# Patient Record
Sex: Male | Born: 1950 | Hispanic: No | Marital: Married | Smoking: Former smoker
Health system: Southern US, Community
[De-identification: ages and names within clinical notes are randomized; demographics above are authoritative.]

## PROBLEM LIST (undated history)

## (undated) DIAGNOSIS — Z8572 Personal history of non-Hodgkin lymphomas: Secondary | ICD-10-CM

## (undated) DIAGNOSIS — L501 Idiopathic urticaria: Secondary | ICD-10-CM

## (undated) DIAGNOSIS — D510 Vitamin B12 deficiency anemia due to intrinsic factor deficiency: Secondary | ICD-10-CM

## (undated) DIAGNOSIS — J302 Other seasonal allergic rhinitis: Secondary | ICD-10-CM

## (undated) DIAGNOSIS — N529 Male erectile dysfunction, unspecified: Secondary | ICD-10-CM

## (undated) DIAGNOSIS — Z8551 Personal history of malignant neoplasm of bladder: Secondary | ICD-10-CM

## (undated) HISTORY — PX: OTHER SURGICAL HISTORY: SHX169

## (undated) HISTORY — DX: Personal history of non-Hodgkin lymphomas: Z85.72

## (undated) HISTORY — DX: Other seasonal allergic rhinitis: J30.2

## (undated) HISTORY — DX: Personal history of malignant neoplasm of bladder: Z85.51

## (undated) HISTORY — DX: Vitamin B12 deficiency anemia due to intrinsic factor deficiency: D51.0

## (undated) HISTORY — DX: Idiopathic urticaria: L50.1

## (undated) HISTORY — DX: Male erectile dysfunction, unspecified: N52.9

---

## 2007-10-27 ENCOUNTER — Ambulatory Visit (HOSPITAL_BASED_OUTPATIENT_CLINIC_OR_DEPARTMENT_OTHER): Admission: RE | Admit: 2007-10-27 | Discharge: 2007-10-28 | Payer: Self-pay | Admitting: Urology

## 2007-10-27 ENCOUNTER — Encounter (INDEPENDENT_AMBULATORY_CARE_PROVIDER_SITE_OTHER): Payer: Self-pay | Admitting: Urology

## 2008-06-16 ENCOUNTER — Ambulatory Visit (HOSPITAL_BASED_OUTPATIENT_CLINIC_OR_DEPARTMENT_OTHER): Admission: RE | Admit: 2008-06-16 | Discharge: 2008-06-16 | Payer: Self-pay | Admitting: Urology

## 2008-06-16 ENCOUNTER — Encounter (INDEPENDENT_AMBULATORY_CARE_PROVIDER_SITE_OTHER): Payer: Self-pay | Admitting: Urology

## 2008-09-20 ENCOUNTER — Encounter (INDEPENDENT_AMBULATORY_CARE_PROVIDER_SITE_OTHER): Payer: Self-pay | Admitting: Urology

## 2008-09-20 ENCOUNTER — Ambulatory Visit (HOSPITAL_BASED_OUTPATIENT_CLINIC_OR_DEPARTMENT_OTHER): Admission: RE | Admit: 2008-09-20 | Discharge: 2008-09-20 | Payer: Self-pay | Admitting: Urology

## 2009-02-28 ENCOUNTER — Ambulatory Visit (HOSPITAL_BASED_OUTPATIENT_CLINIC_OR_DEPARTMENT_OTHER): Admission: RE | Admit: 2009-02-28 | Discharge: 2009-02-28 | Payer: Self-pay | Admitting: Urology

## 2009-02-28 ENCOUNTER — Encounter (INDEPENDENT_AMBULATORY_CARE_PROVIDER_SITE_OTHER): Payer: Self-pay | Admitting: Urology

## 2009-03-04 IMAGING — CR DG CHEST 2V
2 series · 2 of 2 positions shown · non-contrast
Comparison: None.

CLINICAL DATA: Preop, bladder tumor.
 CHEST ? 2 VIEW:

[w chest pa]
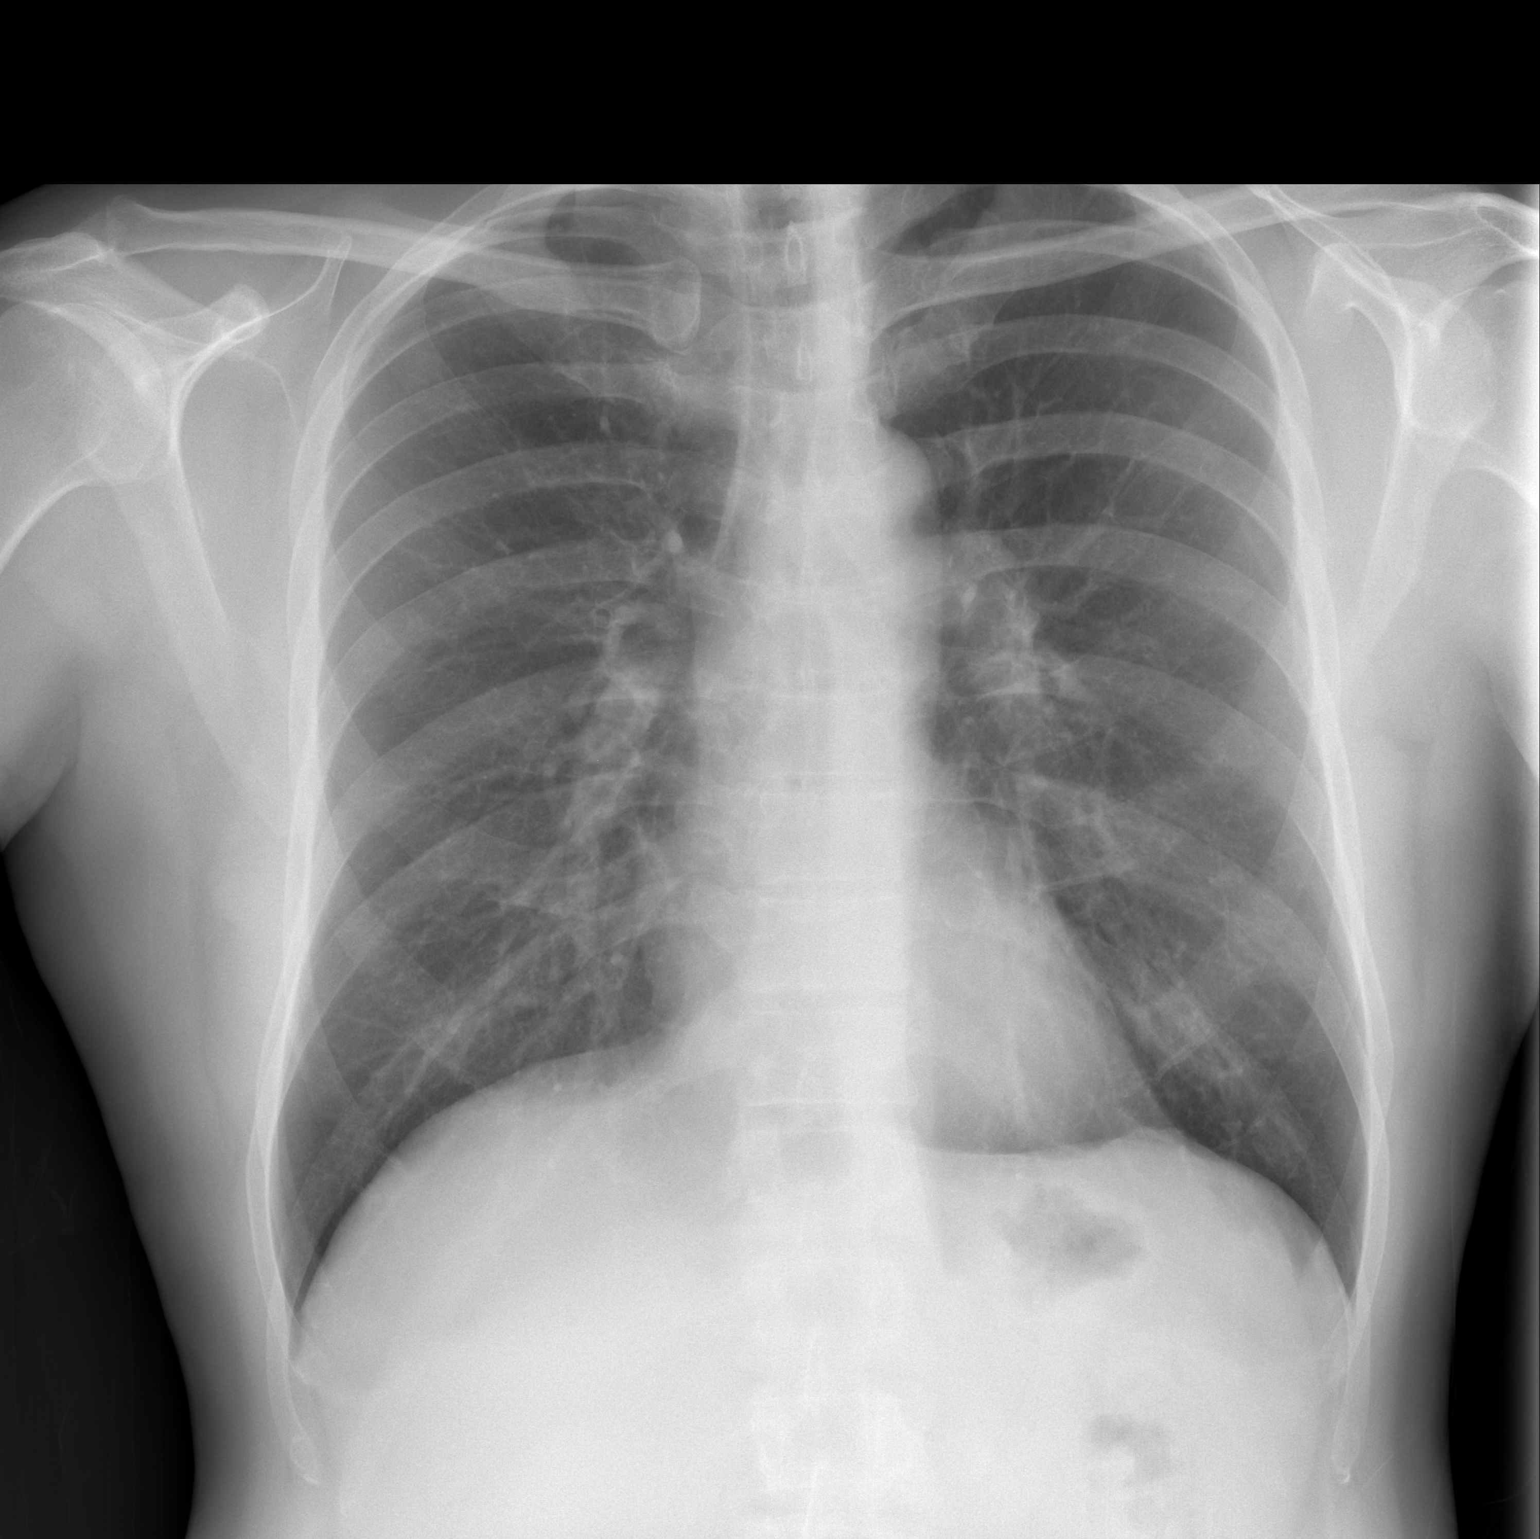

[w chest lat]
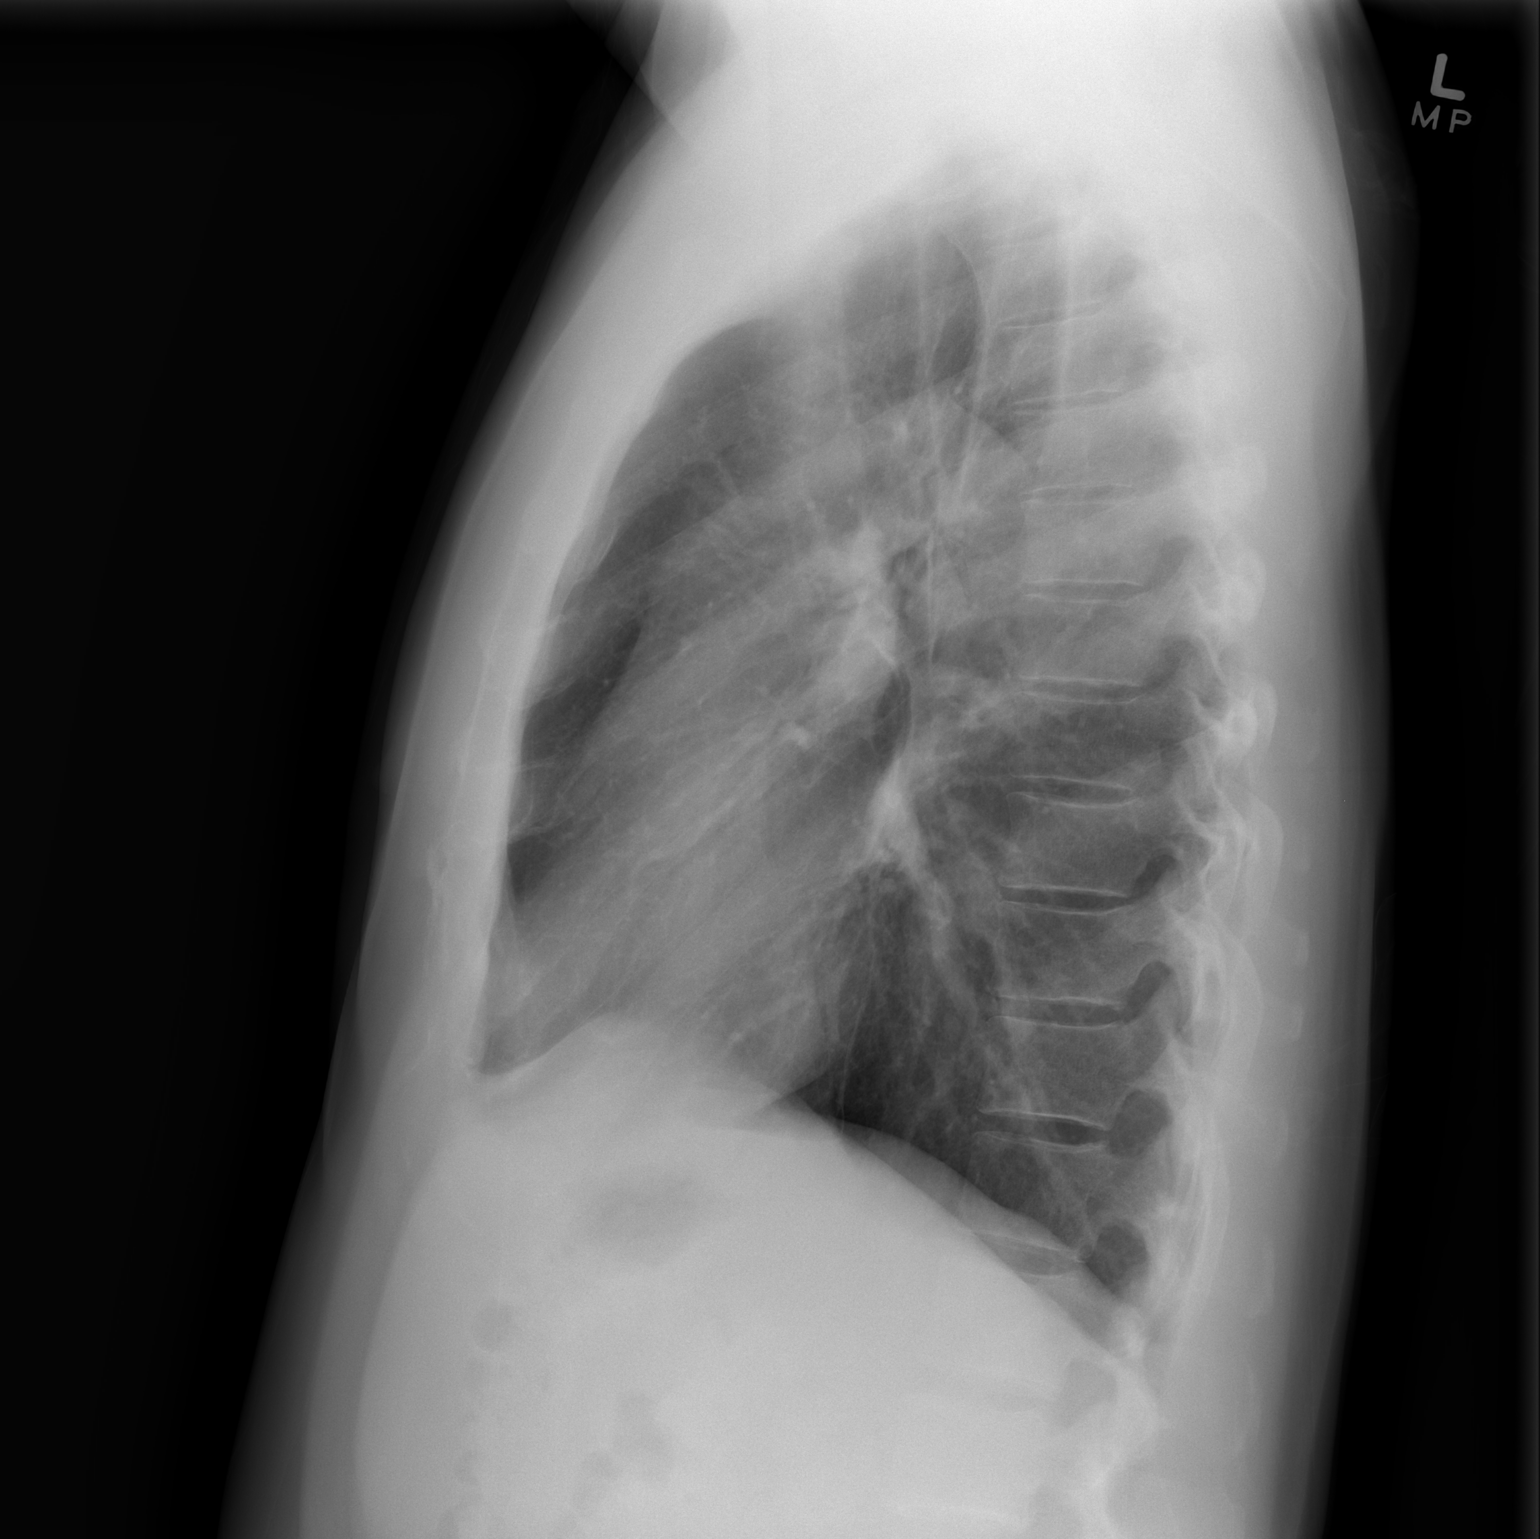

[2 of 2 positions shown; findings below may reference images not displayed]

FINDINGS: Trachea is midline.  Heart size is normal.  Lungs are clear.  No pleural fluid.
IMPRESSION: No acute findings.

## 2010-02-12 ENCOUNTER — Ambulatory Visit (HOSPITAL_BASED_OUTPATIENT_CLINIC_OR_DEPARTMENT_OTHER): Admission: RE | Admit: 2010-02-12 | Discharge: 2010-02-12 | Payer: Self-pay | Admitting: Urology

## 2010-06-18 ENCOUNTER — Ambulatory Visit (HOSPITAL_BASED_OUTPATIENT_CLINIC_OR_DEPARTMENT_OTHER): Admission: RE | Admit: 2010-06-18 | Discharge: 2010-06-18 | Payer: Self-pay | Admitting: Urology

## 2010-12-26 LAB — POCT HEMOGLOBIN-HEMACUE: Hemoglobin: 15.9 g/dL (ref 13.0–17.0)

## 2010-12-31 LAB — POCT HEMOGLOBIN-HEMACUE: Hemoglobin: 16 g/dL (ref 13.0–17.0)

## 2011-01-21 LAB — BASIC METABOLIC PANEL
CO2: 30 mEq/L (ref 19–32)
Calcium: 9.5 mg/dL (ref 8.4–10.5)
Creatinine, Ser: 0.99 mg/dL (ref 0.4–1.5)
GFR calc non Af Amer: >60 mL/min (ref >60)
Sodium: 139 mEq/L (ref 135–145)

## 2011-01-21 LAB — CBC
RBC: 4.82 MIL/uL (ref 4.22–5.81)
RDW: 13.1 % (ref 11.5–15.5)
WBC: 5.7 10*3/uL (ref 4.0–10.5)

## 2011-02-06 ENCOUNTER — Ambulatory Visit (HOSPITAL_BASED_OUTPATIENT_CLINIC_OR_DEPARTMENT_OTHER)
Admission: RE | Admit: 2011-02-06 | Discharge: 2011-02-06 | Disposition: A | Discharge status: Home / Self Care | Payer: 59 | Source: Ambulatory Visit | Attending: Urology | Admitting: Urology

## 2011-02-06 ENCOUNTER — Other Ambulatory Visit: Payer: Self-pay | Admitting: Urology

## 2011-02-06 DIAGNOSIS — Z01812 Encounter for preprocedural laboratory examination: Secondary | ICD-10-CM | POA: Insufficient documentation

## 2011-02-06 DIAGNOSIS — C670 Malignant neoplasm of trigone of bladder: Secondary | ICD-10-CM | POA: Insufficient documentation

## 2011-02-25 NOTE — Op Note (Signed)
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   POSTOPERATIVE DIAGNOSIS:  Recurrent bladder tumor, left anterior wall  (TA grade 1 TCC).   OPERATION:  Transurethral resection of bladder tumor (2 cm left anterior  wall).   ANESTHESIA:  General.   SURGEON:  Courtney Paris, M.D.   BRIEF HISTORY:  This 60 year old white male was admitted with recurrent  bladder tumors.  He presented with gross hematuria in January.  He had  normal upper tract on CT scan but a large bladder tumor at the left  bladder neck and posterior base.  This was all grade 1 TCC stage A.  Recurrence in August, and he was put on the EOquin study.  He now has 2  small recurrences found on routine surveillance cystoscopy in left  anterior wall.  He enters now to have this resected at this time.   The patient was placed on the table in the dorsal lithotomy position  after satisfactory induction of general endotracheal anesthesia.  Time  out was performed, and the patient and the procedure were then  reconfirmed.  A 21 panendoscope was inserted and the bladder inspected  using a right-angle and the 4-0 black lens.  The only tumors were near  the air bubble in the left side in the anterior wall of the bladder.  These were photographed.  There were 2 side-by-side papillary tumors.  The rest of the bladder looked clean.  Using the biopsy forceps, I  removed both of these in toto, and an area of about 2 cm was resected.  Again, this was photographed and the base fulgurated and the final  photograph made.  The bladder was  drained, scope removed, and no  catheter was left inserted.  He was taken to the recovery room in good  condition and will be later discharged as an outpatient.  Since he has  had 3 recurrences in a years' time, he will probably be placed on BCG  therapy, but a FISH test will probably be done first.      Courtney Paris, M.D.  Electronically Signed     HMK/MEDQ  D:  09/20/2008  T:  09/20/2008  Job:  [id number]

## 2011-02-25 NOTE — Discharge Summary (Signed)
Sean Mcintosh, PHIMMASONE                ACCOUNT NO.:  [id number]   MEDICAL RECORD NO.:  [id number]          PATIENT TYPE:  AMB   LOCATION:  [facility]                         FACILITY:  [facility]   PHYSICIAN:  Courtney Paris, M.D.DATE OF BIRTH:  07-05-1951   DATE OF ADMISSION:  10/27/2007  DATE OF DISCHARGE:  10/28/2007                               DISCHARGE SUMMARY   DISCHARGE DIAGNOSES:  1. Large bladder tumor, left posterior base, and bladder neck.  2. Hematuria.   OPERATIONS AND PROCEDURES:  TUR of large bladder tumor, left posterior  wall, October 27, 2007.   BRIEF HISTORY:  This 60 year old patient was admitted for a large  bladder tumor.  He had acute bleeding just last week.  Was able to be  seen in the office and upper tracts were normal, but a cysto showed a  large bladder tumor in the left side of the bladder.  He comes in to  have this resected at this time.  He is a nonsmoker.   The procedure was done in the [facility] without  difficulty.  A #22 Foley catheter was left postoperatively, and the  urine did have to be irrigated a few times but it was generally clear  from the evening on.  Since he lives in [place] and the left orifice  was probably resected as part of the TURBT, he was kept overnight for  observation.  The urine was just light pink in the morning.  He was  feeling well.  The abdomen and left flank were nontender.  He was  discharged home with his catheter and will come back to the office in a  week for followup.  He will be called regarding the path report.  He was  sent home on Cipro and pain medicine as needed on a regular diet in  improved ambulatory condition.      Courtney Paris, M.D.  Electronically Signed     HMK/MEDQ  D:  10/28/2007  T:  10/28/2007  Job:  [id number]

## 2011-02-25 NOTE — Op Note (Signed)
Sean Mcintosh, Sean Mcintosh                ACCOUNT NO.:  [id number]   MEDICAL RECORD NO.:  [id number]          PATIENT TYPE:  AMB   LOCATION:  [facility]                         FACILITY:  [facility]   PHYSICIAN:  Courtney Paris, M.D.DATE OF BIRTH:  1951/04/06   DATE OF PROCEDURE:  06/16/2008  DATE OF DISCHARGE:                               OPERATIVE REPORT   PREOPERATIVE DIAGNOSIS:  Recurrent bladder tumor posterior left base  (T1a 1 cm).   POSTOPERATIVE DIAGNOSIS:  Recurrent bladder tumor posterior left base  (T1a 1 cm).   OPERATION:  TURBT (1 cm).   ANESTHESIA:  General.   SURGEON:  Courtney Paris, M.D.   BRIEF HISTORY:  This 60 year old patient is admitted for recurrent  bladder cancer.  He had his first tumor found because of bleeding in  January of this year.  He has had two negative cystos since then but had  on his third cysto a recurrence on the left posterior base found.  He  enters to have this resected at this time.  He has also agreed to a  bladder cancer study protocol SPI-612 which is a mitomycin versus  placebo drug to help prevent recurrences.  The patient enters for  resection and treatment at this time.   The patient was placed on the operating table in dorsal lithotomy  position and after satisfactory induction of general anesthesia he was  prepped and draped in the usual sterile fashion and given Ancef IV.  After time-out was performed and the patient and the procedure were then  reconfirmed the 21 panendoscope was inserted under direct vision and  anterior urethra looked normal.  The posterior urethra was not  obstructing.  The bladder was entered.  The scar of the previous  resection was noted on the base of the bladder but on the left posterior  midline base there was a solitary recurrence.  It was between 0.5 and 1  cm in size.  Pictures were taken.  This was then completely excised with  two cold cup biopsies and the base fulgurated.  The area resected  was  about 1 cm in size.  Bleeding was minimal.  A Foley catheter was left  indwelling for the infusion of the study drug and B and O suppository  inserted.  He was taken to the recovery room in good condition and would  later be discharged as an outpatient procedure without the catheter if  he is able to void well.      Courtney Paris, M.D.  Electronically Signed     HMK/MEDQ  D:  06/16/2008  T:  06/17/2008  Job:  [id number]

## 2011-02-25 NOTE — Op Note (Signed)
[name], [name]                ACCOUNT NO.:  [id number]   MEDICAL RECORD NO.:  [id number]          PATIENT TYPE:  AMB   LOCATION:  [facility]                         FACILITY:  [facility]   PHYSICIAN:  Courtney Paris, M.D.DATE OF BIRTH:  1951/01/09   DATE OF PROCEDURE:  02/28/2009  DATE OF DISCHARGE:                               OPERATIVE REPORT   PREOPERATIVE DIAGNOSIS:  TA low grade TCC left posterior base.   POSTOPERATIVE DIAGNOSIS:  TA low grade TCC left posterior base.   OPERATION:  Transurethral resection of bladder tumor (1 cm).   ANESTHESIA:  General.   SURGEON:  Rexene Edison Kimbrought, M.D.   BRIEF HISTORY:  This 60 year old patient is admitted with recurrent  bladder cancer found on routine surveillance cystoscopy.  His first  tumor was a grade 1 TCC of the left bladder neck and base.  Upper tracts  were normal.  This was done January 2009.  This was all superficial  grade 1 transitional cell carcinoma.  The cysto was negative in April  2009 but he had a recurrent grade 1 tumor in August 2009.  He had a  TURBT at that time and put on the EOquin study.  He had another grade 1  recurrent tumor in December 2009, finished BCG in February 2010 and  cysto this month showed a small 1-1.5 cm tumor in the left posterior  base.  He needs to have this resected at this time.   PROCEDURE IN DETAIL:  The patient was placed on the operating table in  the dorsal lithotomy position.  After satisfactory induction of general  anesthesia, he was prepped and draped with Betadine and given IV Cipro.  A time-out was then performed and the patient and the procedure were  then reconfirmed.  A 21 panendoscope was inserted.  No anterior  strictures were seen.  The posterior urethra was mildly obstructing in a  bilobar fashion.  The bladder was entered.  The bladder was carefully  inspected and a small tumor was seen and photographed at the left  posterior base between two vessels.  With cold cup biopsy  forceps, the  lesion and the slightly irritated area just below this were biopsied,  the total area of resection was about 1 cm.  The base was fulgurated  with a Bugbee electrode to effect good hemostasis.  The bladder seemed  to be fairly thick and I did not think a Foley catheter would need to be  left.  The bladder was drained and scope removed and he was given a B  and O suppository and some antral urethral Xylocaine anesthesia.  He  will later be sent home as an outpatient and follow-up next week for  further disposition pending path report.      Courtney Paris, M.D.  Electronically Signed     HMK/MEDQ  D:  02/28/2009  T:  02/28/2009  Job:  [id number]

## 2011-02-25 NOTE — Op Note (Signed)
Sean Mcintosh, Sean Mcintosh                ACCOUNT NO.:  [id number]   MEDICAL RECORD NO.:  [id number]          PATIENT TYPE:  AMB   LOCATION:  [facility]                         FACILITY:  [facility]   PHYSICIAN:  Courtney Paris, M.D.DATE OF BIRTH:  [birth year]   DATE OF PROCEDURE:  10/27/2007  DATE OF DISCHARGE:                               OPERATIVE REPORT   PREOPERATIVE DIAGNOSIS:  Large bladder tumor bladder neck and left  trigone.   POSTOPERATIVE DIAGNOSIS:  Large bladder tumor bladder neck and left  trigone.   OPERATION:  TUR large bladder tumor (greater than 5 cm).   ANESTHESIA:  General.   SURGEON:  Courtney Paris, M.D.   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  The bleeding  actually began the day before.  Cystoscopy showed a large bladder tumor  at the left bladder neck.  Upper tracts were normal on CT scan and he  enters now for resection of this at this time.   The patient was placed on the operating table in dorsal lithotomy  position after satisfactory induction of general anesthesia, was prepped  and draped in the usual sterile fashion and given IV Cipro.  A 24  continuous flow resectoscope was inserted and  the bladder inspected.  The large tumor at the bladder neck extending down onto the left trigone  was photographed and then with first the regular loop and then the  bladder wall loop, this was carefully shaved down to muscle, excising  the entire lesion.  This must have been very close to the left orifice  as I could not identify this after the resection.  Hemostasis was good.  The chips were evacuated from the bladder and photographs were then made  of the resultant defect in the bladder.  The scope was removed and a 22  Foley catheter was inserted, 10 mL was inserted into the balloon and the  irrigant was clear.  The patient was then taken recovery room and will  be kept for  overnight observation since he lives in [place], [place], and because the left orifice may have been involved with the  resection.      Courtney Paris, M.D.  Electronically Signed     HMK/MEDQ  D:  10/27/2007  T:  10/27/2007  Job:  [id number]

## 2011-02-27 NOTE — Op Note (Signed)
  NAME:  Sean Mcintosh, Sean Mcintosh          ACCOUNT NO.:  [id number]  MEDICAL RECORD NO.:  [id number]           PATIENT TYPE:  LOCATION:                                 FACILITY:  PHYSICIAN:  Courtney Paris, M.D.  DATE OF BIRTH:  DATE OF PROCEDURE:  02/06/2011 DATE OF DISCHARGE:                              OPERATIVE REPORT   PREOPERATIVE DIAGNOSIS:  Possible recurrent bladder cancer, left and right trigone.  POSTOPERATIVE DIAGNOSIS:  Possible recurrent bladder cancer, left and right trigone.  OPERATION:  Transurethral resection of bladder tumor 1.5 cm, left and right trigone.  ANESTHESIA:  General.  SURGEON:  Courtney Paris, M.D.  BRIEF HISTORY:  This 60 year old patient is admitted with recurrent low- grade bladder cancer.  First tumor was a large but stage A low-grade transitional cell carcinoma, resected in January 2009.  He had a recurrence in August 2009 and December 2009.  He finished BCG in February 2010.  He had a negative biopsy done of some inflammatory tissue, May 2010, probably from the BCG treatments.  FISH was negative in May 2010, so we have not done any more BCG.  He did have a small recurrence in April 2011 and in September 2011 when I gave him some mitomycin postoperatively, which he had more bleeding than usual.  He has no irritative symptoms but on cystoscopy in April 2012, he had a small possible recurrence on the right trigone overlying the right orifice and another small area on the left trigone.  He is admitted now for biopsy of these lesions.  PROCEDURE IN DETAIL:  The patient was placed on the operating table in dorsal lithotomy position.  After satisfactory induction of general anesthesia, he was prepped and draped with Betadine in usual sterile fashion.  Time-out was then performed and the patient and procedure then reconfirmed.  After pictures were made of the lesion overlying the right ureteral orifice which was the larger one, biopsies  were made carefully with the cold cup biopsy forceps of the right and then also lesion on the left trigone.  These were sent as separate specimens.  These were cauterized mildly with the Bugbee electrode to effect good hemostasis. Scope was removed after draining the bladder and urethral lidocaine was instilled as well as a B and O suppository.  He was taken to recovery room in good condition, to be later discharged as an outpatient.  We will go over the pathology report next week in the office.     Courtney Paris, M.D.    HMK/MEDQ  D:  02/06/2011  T:  02/06/2011  Job:  [id number]  Electronically Signed by Vic Blackbird M.D. on 02/27/2011 05:19:55 PM

## 2011-07-03 LAB — CBC
HCT: 43.3
MCV: 90.8
Platelets: 281
WBC: 5.3

## 2011-07-03 LAB — URINALYSIS, ROUTINE W REFLEX MICROSCOPIC
Bilirubin Urine: NEGATIVE
Glucose, UA: NEGATIVE
Hgb urine dipstick: NEGATIVE
Ketones, ur: NEGATIVE
Protein, ur: NEGATIVE

## 2011-07-03 LAB — COMPREHENSIVE METABOLIC PANEL
AST: 17
CO2: 28
Calcium: 9.2
Creatinine, Ser: 1.03
GFR calc non Af Amer: >60

## 2011-07-16 LAB — BASIC METABOLIC PANEL
BUN: 10
Calcium: 9.2
Creatinine, Ser: 1.04
GFR calc non Af Amer: >60

## 2011-07-16 LAB — URINE MICROSCOPIC-ADD ON

## 2011-07-16 LAB — URINALYSIS, ROUTINE W REFLEX MICROSCOPIC
Leukocytes, UA: NEGATIVE
Nitrite: NEGATIVE
Specific Gravity, Urine: 1.016
Urobilinogen, UA: 0.2

## 2011-07-16 LAB — CBC
Platelets: 265
RDW: 12.8

## 2012-06-01 DIAGNOSIS — C679 Malignant neoplasm of bladder, unspecified: Secondary | ICD-10-CM | POA: Insufficient documentation

## 2016-01-27 DIAGNOSIS — M7741 Metatarsalgia, right foot: Secondary | ICD-10-CM | POA: Insufficient documentation

## 2016-01-27 DIAGNOSIS — M778 Other enthesopathies, not elsewhere classified: Secondary | ICD-10-CM | POA: Insufficient documentation

## 2016-11-19 DIAGNOSIS — H2513 Age-related nuclear cataract, bilateral: Secondary | ICD-10-CM | POA: Diagnosis not present

## 2016-11-19 DIAGNOSIS — H5203 Hypermetropia, bilateral: Secondary | ICD-10-CM | POA: Diagnosis not present

## 2016-12-16 DIAGNOSIS — J3489 Other specified disorders of nose and nasal sinuses: Secondary | ICD-10-CM | POA: Diagnosis not present

## 2016-12-16 DIAGNOSIS — J309 Allergic rhinitis, unspecified: Secondary | ICD-10-CM | POA: Diagnosis not present

## 2016-12-16 DIAGNOSIS — J208 Acute bronchitis due to other specified organisms: Secondary | ICD-10-CM | POA: Diagnosis not present

## 2016-12-25 DIAGNOSIS — Z6822 Body mass index (BMI) 22.0-22.9, adult: Secondary | ICD-10-CM | POA: Diagnosis not present

## 2016-12-25 DIAGNOSIS — R509 Fever, unspecified: Secondary | ICD-10-CM | POA: Diagnosis not present

## 2016-12-25 DIAGNOSIS — J101 Influenza due to other identified influenza virus with other respiratory manifestations: Secondary | ICD-10-CM | POA: Diagnosis not present

## 2016-12-29 DIAGNOSIS — Z6822 Body mass index (BMI) 22.0-22.9, adult: Secondary | ICD-10-CM | POA: Diagnosis not present

## 2016-12-29 DIAGNOSIS — J111 Influenza due to unidentified influenza virus with other respiratory manifestations: Secondary | ICD-10-CM | POA: Diagnosis not present

## 2017-02-12 DIAGNOSIS — Z0000 Encounter for general adult medical examination without abnormal findings: Secondary | ICD-10-CM | POA: Diagnosis not present

## 2017-02-12 DIAGNOSIS — Z6823 Body mass index (BMI) 23.0-23.9, adult: Secondary | ICD-10-CM | POA: Diagnosis not present

## 2017-02-12 DIAGNOSIS — R590 Localized enlarged lymph nodes: Secondary | ICD-10-CM | POA: Diagnosis not present

## 2017-02-12 DIAGNOSIS — Z1389 Encounter for screening for other disorder: Secondary | ICD-10-CM | POA: Diagnosis not present

## 2017-02-17 DIAGNOSIS — R590 Localized enlarged lymph nodes: Secondary | ICD-10-CM | POA: Insufficient documentation

## 2017-04-13 DIAGNOSIS — R590 Localized enlarged lymph nodes: Secondary | ICD-10-CM | POA: Diagnosis not present

## 2017-04-24 HISTORY — PX: INGUINAL LYMPH NODE BIOPSY: SHX5865

## 2017-04-29 ENCOUNTER — Other Ambulatory Visit (HOSPITAL_COMMUNITY)
Admission: RE | Admit: 2017-04-29 | Discharge: 2017-04-29 | Disposition: A | Discharge status: Home / Self Care | Payer: Self-pay | Source: Ambulatory Visit | Attending: Vascular Surgery | Admitting: Vascular Surgery

## 2017-04-29 DIAGNOSIS — Z8551 Personal history of malignant neoplasm of bladder: Secondary | ICD-10-CM | POA: Diagnosis not present

## 2017-04-29 DIAGNOSIS — R590 Localized enlarged lymph nodes: Secondary | ICD-10-CM | POA: Diagnosis not present

## 2017-04-29 DIAGNOSIS — Z87891 Personal history of nicotine dependence: Secondary | ICD-10-CM | POA: Diagnosis not present

## 2017-04-29 DIAGNOSIS — L501 Idiopathic urticaria: Secondary | ICD-10-CM | POA: Diagnosis not present

## 2017-04-29 DIAGNOSIS — C8305 Small cell B-cell lymphoma, lymph nodes of inguinal region and lower limb: Secondary | ICD-10-CM | POA: Diagnosis not present

## 2017-04-29 DIAGNOSIS — Z79899 Other long term (current) drug therapy: Secondary | ICD-10-CM | POA: Diagnosis not present

## 2017-04-29 DIAGNOSIS — C8585 Other specified types of non-Hodgkin lymphoma, lymph nodes of inguinal region and lower limb: Secondary | ICD-10-CM | POA: Diagnosis not present

## 2017-04-29 DIAGNOSIS — N529 Male erectile dysfunction, unspecified: Secondary | ICD-10-CM | POA: Diagnosis not present

## 2017-04-30 ENCOUNTER — Other Ambulatory Visit (HOSPITAL_COMMUNITY)
Admission: RE | Admit: 2017-04-30 | Disposition: A | Discharge status: Home / Self Care | Payer: Self-pay | Source: Ambulatory Visit | Attending: Pathology | Admitting: Pathology

## 2017-04-30 DIAGNOSIS — C8305 Small cell B-cell lymphoma, lymph nodes of inguinal region and lower limb: Secondary | ICD-10-CM | POA: Diagnosis not present

## 2017-05-07 DIAGNOSIS — C8580 Other specified types of non-Hodgkin lymphoma, unspecified site: Secondary | ICD-10-CM | POA: Diagnosis not present

## 2017-05-11 DIAGNOSIS — C8300 Small cell B-cell lymphoma, unspecified site: Secondary | ICD-10-CM | POA: Insufficient documentation

## 2017-05-19 DIAGNOSIS — Z1159 Encounter for screening for other viral diseases: Secondary | ICD-10-CM | POA: Diagnosis not present

## 2017-05-19 DIAGNOSIS — C8308 Small cell B-cell lymphoma, lymph nodes of multiple sites: Secondary | ICD-10-CM | POA: Diagnosis not present

## 2017-05-19 DIAGNOSIS — C8590 Non-Hodgkin lymphoma, unspecified, unspecified site: Secondary | ICD-10-CM | POA: Diagnosis not present

## 2017-05-28 DIAGNOSIS — Z8551 Personal history of malignant neoplasm of bladder: Secondary | ICD-10-CM | POA: Diagnosis not present

## 2017-06-05 DIAGNOSIS — C8590 Non-Hodgkin lymphoma, unspecified, unspecified site: Secondary | ICD-10-CM | POA: Diagnosis not present

## 2017-06-05 DIAGNOSIS — C8308 Small cell B-cell lymphoma, lymph nodes of multiple sites: Secondary | ICD-10-CM | POA: Diagnosis not present

## 2017-06-09 DIAGNOSIS — C8308 Small cell B-cell lymphoma, lymph nodes of multiple sites: Secondary | ICD-10-CM | POA: Diagnosis not present

## 2017-06-12 ENCOUNTER — Encounter (HOSPITAL_COMMUNITY): Payer: Self-pay

## 2017-08-07 LAB — TISSUE HYBRIDIZATION TO NCBH

## 2017-08-25 DIAGNOSIS — Z23 Encounter for immunization: Secondary | ICD-10-CM | POA: Diagnosis not present

## 2017-09-22 DIAGNOSIS — C8300 Small cell B-cell lymphoma, unspecified site: Secondary | ICD-10-CM | POA: Diagnosis not present

## 2017-09-22 DIAGNOSIS — C8308 Small cell B-cell lymphoma, lymph nodes of multiple sites: Secondary | ICD-10-CM | POA: Diagnosis not present

## 2017-10-02 DIAGNOSIS — D485 Neoplasm of uncertain behavior of skin: Secondary | ICD-10-CM | POA: Diagnosis not present

## 2017-10-02 DIAGNOSIS — D0461 Carcinoma in situ of skin of right upper limb, including shoulder: Secondary | ICD-10-CM | POA: Diagnosis not present

## 2017-10-02 DIAGNOSIS — Z85828 Personal history of other malignant neoplasm of skin: Secondary | ICD-10-CM | POA: Diagnosis not present

## 2017-10-02 DIAGNOSIS — L570 Actinic keratosis: Secondary | ICD-10-CM | POA: Diagnosis not present

## 2017-10-02 DIAGNOSIS — L812 Freckles: Secondary | ICD-10-CM | POA: Diagnosis not present

## 2017-10-02 DIAGNOSIS — D1801 Hemangioma of skin and subcutaneous tissue: Secondary | ICD-10-CM | POA: Diagnosis not present

## 2017-10-02 DIAGNOSIS — L821 Other seborrheic keratosis: Secondary | ICD-10-CM | POA: Diagnosis not present

## 2017-11-23 DIAGNOSIS — H2513 Age-related nuclear cataract, bilateral: Secondary | ICD-10-CM | POA: Diagnosis not present

## 2017-11-23 DIAGNOSIS — H52203 Unspecified astigmatism, bilateral: Secondary | ICD-10-CM | POA: Diagnosis not present

## 2017-11-23 DIAGNOSIS — H524 Presbyopia: Secondary | ICD-10-CM | POA: Diagnosis not present

## 2017-11-27 DIAGNOSIS — R5383 Other fatigue: Secondary | ICD-10-CM | POA: Diagnosis not present

## 2017-11-27 DIAGNOSIS — Z6823 Body mass index (BMI) 23.0-23.9, adult: Secondary | ICD-10-CM | POA: Diagnosis not present

## 2017-11-27 DIAGNOSIS — J302 Other seasonal allergic rhinitis: Secondary | ICD-10-CM | POA: Diagnosis not present

## 2017-11-27 DIAGNOSIS — Z13220 Encounter for screening for lipoid disorders: Secondary | ICD-10-CM | POA: Diagnosis not present

## 2017-11-27 DIAGNOSIS — D519 Vitamin B12 deficiency anemia, unspecified: Secondary | ICD-10-CM | POA: Diagnosis not present

## 2017-11-27 DIAGNOSIS — Z125 Encounter for screening for malignant neoplasm of prostate: Secondary | ICD-10-CM | POA: Diagnosis not present

## 2017-11-27 DIAGNOSIS — Z8572 Personal history of non-Hodgkin lymphomas: Secondary | ICD-10-CM | POA: Diagnosis not present

## 2017-11-27 DIAGNOSIS — L501 Idiopathic urticaria: Secondary | ICD-10-CM | POA: Diagnosis not present

## 2017-12-11 DIAGNOSIS — E538 Deficiency of other specified B group vitamins: Secondary | ICD-10-CM | POA: Diagnosis not present

## 2017-12-30 DIAGNOSIS — D510 Vitamin B12 deficiency anemia due to intrinsic factor deficiency: Secondary | ICD-10-CM | POA: Diagnosis not present

## 2018-01-05 DIAGNOSIS — D510 Vitamin B12 deficiency anemia due to intrinsic factor deficiency: Secondary | ICD-10-CM | POA: Diagnosis not present

## 2018-01-12 DIAGNOSIS — D510 Vitamin B12 deficiency anemia due to intrinsic factor deficiency: Secondary | ICD-10-CM | POA: Diagnosis not present

## 2018-02-02 DIAGNOSIS — C8308 Small cell B-cell lymphoma, lymph nodes of multiple sites: Secondary | ICD-10-CM | POA: Diagnosis not present

## 2018-02-02 DIAGNOSIS — C8300 Small cell B-cell lymphoma, unspecified site: Secondary | ICD-10-CM | POA: Diagnosis not present

## 2018-03-01 DIAGNOSIS — A09 Infectious gastroenteritis and colitis, unspecified: Secondary | ICD-10-CM | POA: Diagnosis not present

## 2018-03-01 DIAGNOSIS — Z6822 Body mass index (BMI) 22.0-22.9, adult: Secondary | ICD-10-CM | POA: Diagnosis not present

## 2018-03-01 DIAGNOSIS — D510 Vitamin B12 deficiency anemia due to intrinsic factor deficiency: Secondary | ICD-10-CM | POA: Diagnosis not present

## 2018-03-01 DIAGNOSIS — Z0000 Encounter for general adult medical examination without abnormal findings: Secondary | ICD-10-CM | POA: Diagnosis not present

## 2018-03-01 DIAGNOSIS — K529 Noninfective gastroenteritis and colitis, unspecified: Secondary | ICD-10-CM | POA: Diagnosis not present

## 2018-03-03 DIAGNOSIS — M722 Plantar fascial fibromatosis: Secondary | ICD-10-CM | POA: Insufficient documentation

## 2018-03-03 DIAGNOSIS — R52 Pain, unspecified: Secondary | ICD-10-CM | POA: Insufficient documentation

## 2018-03-03 DIAGNOSIS — M79672 Pain in left foot: Secondary | ICD-10-CM | POA: Diagnosis not present

## 2018-04-22 DIAGNOSIS — D510 Vitamin B12 deficiency anemia due to intrinsic factor deficiency: Secondary | ICD-10-CM | POA: Diagnosis not present

## 2018-04-22 DIAGNOSIS — J40 Bronchitis, not specified as acute or chronic: Secondary | ICD-10-CM | POA: Diagnosis not present

## 2018-04-22 DIAGNOSIS — Z6822 Body mass index (BMI) 22.0-22.9, adult: Secondary | ICD-10-CM | POA: Diagnosis not present

## 2018-06-02 DIAGNOSIS — D510 Vitamin B12 deficiency anemia due to intrinsic factor deficiency: Secondary | ICD-10-CM | POA: Diagnosis not present

## 2018-06-03 DIAGNOSIS — Z8551 Personal history of malignant neoplasm of bladder: Secondary | ICD-10-CM | POA: Diagnosis not present

## 2018-06-30 DIAGNOSIS — D510 Vitamin B12 deficiency anemia due to intrinsic factor deficiency: Secondary | ICD-10-CM | POA: Diagnosis not present

## 2018-07-23 DIAGNOSIS — Z23 Encounter for immunization: Secondary | ICD-10-CM | POA: Diagnosis not present

## 2018-07-29 DIAGNOSIS — Z85828 Personal history of other malignant neoplasm of skin: Secondary | ICD-10-CM | POA: Diagnosis not present

## 2018-07-29 DIAGNOSIS — D485 Neoplasm of uncertain behavior of skin: Secondary | ICD-10-CM | POA: Diagnosis not present

## 2018-07-29 DIAGNOSIS — L812 Freckles: Secondary | ICD-10-CM | POA: Diagnosis not present

## 2018-07-29 DIAGNOSIS — L821 Other seborrheic keratosis: Secondary | ICD-10-CM | POA: Diagnosis not present

## 2018-07-29 DIAGNOSIS — C44529 Squamous cell carcinoma of skin of other part of trunk: Secondary | ICD-10-CM | POA: Diagnosis not present

## 2018-07-29 DIAGNOSIS — L720 Epidermal cyst: Secondary | ICD-10-CM | POA: Diagnosis not present

## 2018-07-29 DIAGNOSIS — L570 Actinic keratosis: Secondary | ICD-10-CM | POA: Diagnosis not present

## 2018-07-29 DIAGNOSIS — C44629 Squamous cell carcinoma of skin of left upper limb, including shoulder: Secondary | ICD-10-CM | POA: Diagnosis not present

## 2018-08-10 DIAGNOSIS — C8300 Small cell B-cell lymphoma, unspecified site: Secondary | ICD-10-CM | POA: Diagnosis not present

## 2018-08-12 DIAGNOSIS — D510 Vitamin B12 deficiency anemia due to intrinsic factor deficiency: Secondary | ICD-10-CM | POA: Diagnosis not present

## 2018-09-13 DIAGNOSIS — D510 Vitamin B12 deficiency anemia due to intrinsic factor deficiency: Secondary | ICD-10-CM | POA: Diagnosis not present

## 2018-10-01 DIAGNOSIS — D0461 Carcinoma in situ of skin of right upper limb, including shoulder: Secondary | ICD-10-CM | POA: Diagnosis not present

## 2018-10-01 DIAGNOSIS — L812 Freckles: Secondary | ICD-10-CM | POA: Diagnosis not present

## 2018-10-01 DIAGNOSIS — L570 Actinic keratosis: Secondary | ICD-10-CM | POA: Diagnosis not present

## 2018-10-01 DIAGNOSIS — Z85828 Personal history of other malignant neoplasm of skin: Secondary | ICD-10-CM | POA: Diagnosis not present

## 2018-10-01 DIAGNOSIS — D485 Neoplasm of uncertain behavior of skin: Secondary | ICD-10-CM | POA: Diagnosis not present

## 2018-10-01 DIAGNOSIS — L821 Other seborrheic keratosis: Secondary | ICD-10-CM | POA: Diagnosis not present

## 2018-10-20 DIAGNOSIS — D510 Vitamin B12 deficiency anemia due to intrinsic factor deficiency: Secondary | ICD-10-CM | POA: Diagnosis not present

## 2018-11-25 DIAGNOSIS — H2513 Age-related nuclear cataract, bilateral: Secondary | ICD-10-CM | POA: Diagnosis not present

## 2018-11-30 DIAGNOSIS — D510 Vitamin B12 deficiency anemia due to intrinsic factor deficiency: Secondary | ICD-10-CM | POA: Diagnosis not present

## 2019-02-07 DIAGNOSIS — C8308 Small cell B-cell lymphoma, lymph nodes of multiple sites: Secondary | ICD-10-CM | POA: Diagnosis not present

## 2019-04-04 DIAGNOSIS — E538 Deficiency of other specified B group vitamins: Secondary | ICD-10-CM | POA: Diagnosis not present

## 2019-04-04 DIAGNOSIS — Z79899 Other long term (current) drug therapy: Secondary | ICD-10-CM | POA: Diagnosis not present

## 2019-04-05 DIAGNOSIS — D692 Other nonthrombocytopenic purpura: Secondary | ICD-10-CM | POA: Diagnosis not present

## 2019-04-05 DIAGNOSIS — D0472 Carcinoma in situ of skin of left lower limb, including hip: Secondary | ICD-10-CM | POA: Diagnosis not present

## 2019-04-05 DIAGNOSIS — L812 Freckles: Secondary | ICD-10-CM | POA: Diagnosis not present

## 2019-04-05 DIAGNOSIS — L820 Inflamed seborrheic keratosis: Secondary | ICD-10-CM | POA: Diagnosis not present

## 2019-04-05 DIAGNOSIS — D1801 Hemangioma of skin and subcutaneous tissue: Secondary | ICD-10-CM | POA: Diagnosis not present

## 2019-04-05 DIAGNOSIS — Z85828 Personal history of other malignant neoplasm of skin: Secondary | ICD-10-CM | POA: Diagnosis not present

## 2019-04-05 DIAGNOSIS — L570 Actinic keratosis: Secondary | ICD-10-CM | POA: Diagnosis not present

## 2019-04-05 DIAGNOSIS — D485 Neoplasm of uncertain behavior of skin: Secondary | ICD-10-CM | POA: Diagnosis not present

## 2019-04-05 DIAGNOSIS — L821 Other seborrheic keratosis: Secondary | ICD-10-CM | POA: Diagnosis not present

## 2019-04-18 DIAGNOSIS — N2889 Other specified disorders of kidney and ureter: Secondary | ICD-10-CM | POA: Diagnosis not present

## 2019-04-18 DIAGNOSIS — C8300 Small cell B-cell lymphoma, unspecified site: Secondary | ICD-10-CM | POA: Diagnosis not present

## 2019-04-19 DIAGNOSIS — C8308 Small cell B-cell lymphoma, lymph nodes of multiple sites: Secondary | ICD-10-CM | POA: Diagnosis not present

## 2019-04-19 DIAGNOSIS — C8300 Small cell B-cell lymphoma, unspecified site: Secondary | ICD-10-CM | POA: Diagnosis not present

## 2019-04-28 DIAGNOSIS — Z01818 Encounter for other preprocedural examination: Secondary | ICD-10-CM | POA: Diagnosis not present

## 2019-05-06 DIAGNOSIS — C8300 Small cell B-cell lymphoma, unspecified site: Secondary | ICD-10-CM | POA: Diagnosis not present

## 2019-05-06 DIAGNOSIS — C8308 Small cell B-cell lymphoma, lymph nodes of multiple sites: Secondary | ICD-10-CM | POA: Diagnosis not present

## 2019-05-06 DIAGNOSIS — R897 Abnormal histological findings in specimens from other organs, systems and tissues: Secondary | ICD-10-CM | POA: Diagnosis not present

## 2019-05-06 DIAGNOSIS — C9110 Chronic lymphocytic leukemia of B-cell type not having achieved remission: Secondary | ICD-10-CM | POA: Diagnosis not present

## 2019-05-06 DIAGNOSIS — R591 Generalized enlarged lymph nodes: Secondary | ICD-10-CM | POA: Diagnosis not present

## 2019-05-16 ENCOUNTER — Other Ambulatory Visit: Payer: Self-pay

## 2019-06-07 DIAGNOSIS — C8300 Small cell B-cell lymphoma, unspecified site: Secondary | ICD-10-CM | POA: Diagnosis not present

## 2019-06-07 DIAGNOSIS — C8308 Small cell B-cell lymphoma, lymph nodes of multiple sites: Secondary | ICD-10-CM | POA: Diagnosis not present

## 2019-06-09 DIAGNOSIS — Z8551 Personal history of malignant neoplasm of bladder: Secondary | ICD-10-CM | POA: Diagnosis not present

## 2019-06-09 DIAGNOSIS — N528 Other male erectile dysfunction: Secondary | ICD-10-CM | POA: Diagnosis not present

## 2019-07-12 DIAGNOSIS — C8300 Small cell B-cell lymphoma, unspecified site: Secondary | ICD-10-CM | POA: Diagnosis not present

## 2019-07-12 DIAGNOSIS — C8308 Small cell B-cell lymphoma, lymph nodes of multiple sites: Secondary | ICD-10-CM | POA: Diagnosis not present

## 2019-07-28 DIAGNOSIS — D519 Vitamin B12 deficiency anemia, unspecified: Secondary | ICD-10-CM | POA: Diagnosis not present

## 2019-07-28 DIAGNOSIS — Z23 Encounter for immunization: Secondary | ICD-10-CM | POA: Diagnosis not present

## 2019-08-09 DIAGNOSIS — C8308 Small cell B-cell lymphoma, lymph nodes of multiple sites: Secondary | ICD-10-CM | POA: Diagnosis not present

## 2019-08-09 DIAGNOSIS — C8300 Small cell B-cell lymphoma, unspecified site: Secondary | ICD-10-CM | POA: Diagnosis not present

## 2019-09-13 DIAGNOSIS — C8300 Small cell B-cell lymphoma, unspecified site: Secondary | ICD-10-CM | POA: Diagnosis not present

## 2019-09-13 DIAGNOSIS — C8308 Small cell B-cell lymphoma, lymph nodes of multiple sites: Secondary | ICD-10-CM | POA: Diagnosis not present

## 2020-06-05 DIAGNOSIS — R918 Other nonspecific abnormal finding of lung field: Secondary | ICD-10-CM | POA: Diagnosis not present

## 2020-06-05 DIAGNOSIS — C8308 Small cell B-cell lymphoma, lymph nodes of multiple sites: Secondary | ICD-10-CM | POA: Diagnosis not present

## 2020-06-05 DIAGNOSIS — K769 Liver disease, unspecified: Secondary | ICD-10-CM | POA: Diagnosis not present

## 2020-06-05 DIAGNOSIS — C8590 Non-Hodgkin lymphoma, unspecified, unspecified site: Secondary | ICD-10-CM | POA: Diagnosis not present

## 2020-06-05 DIAGNOSIS — N289 Disorder of kidney and ureter, unspecified: Secondary | ICD-10-CM | POA: Diagnosis not present

## 2020-06-12 DIAGNOSIS — C8308 Small cell B-cell lymphoma, lymph nodes of multiple sites: Secondary | ICD-10-CM | POA: Diagnosis not present

## 2020-07-04 DIAGNOSIS — L814 Other melanin hyperpigmentation: Secondary | ICD-10-CM | POA: Diagnosis not present

## 2020-07-04 DIAGNOSIS — L821 Other seborrheic keratosis: Secondary | ICD-10-CM | POA: Diagnosis not present

## 2020-07-04 DIAGNOSIS — L853 Xerosis cutis: Secondary | ICD-10-CM | POA: Diagnosis not present

## 2020-07-04 DIAGNOSIS — L570 Actinic keratosis: Secondary | ICD-10-CM | POA: Diagnosis not present

## 2020-07-04 DIAGNOSIS — Z85828 Personal history of other malignant neoplasm of skin: Secondary | ICD-10-CM | POA: Diagnosis not present

## 2020-07-04 DIAGNOSIS — D1801 Hemangioma of skin and subcutaneous tissue: Secondary | ICD-10-CM | POA: Diagnosis not present

## 2020-08-16 DIAGNOSIS — R310 Gross hematuria: Secondary | ICD-10-CM | POA: Diagnosis not present

## 2020-08-16 DIAGNOSIS — N528 Other male erectile dysfunction: Secondary | ICD-10-CM | POA: Diagnosis not present

## 2020-08-16 DIAGNOSIS — C672 Malignant neoplasm of lateral wall of bladder: Secondary | ICD-10-CM | POA: Diagnosis not present

## 2020-08-16 DIAGNOSIS — R718 Other abnormality of red blood cells: Secondary | ICD-10-CM | POA: Diagnosis not present

## 2020-08-16 DIAGNOSIS — R82998 Other abnormal findings in urine: Secondary | ICD-10-CM | POA: Diagnosis not present

## 2020-09-11 DIAGNOSIS — C8308 Small cell B-cell lymphoma, lymph nodes of multiple sites: Secondary | ICD-10-CM | POA: Diagnosis not present

## 2020-09-11 DIAGNOSIS — C8300 Small cell B-cell lymphoma, unspecified site: Secondary | ICD-10-CM | POA: Diagnosis not present

## 2020-09-15 DIAGNOSIS — C8390 Non-follicular (diffuse) lymphoma, unspecified, unspecified site: Secondary | ICD-10-CM | POA: Insufficient documentation

## 2020-11-30 DIAGNOSIS — H2513 Age-related nuclear cataract, bilateral: Secondary | ICD-10-CM | POA: Diagnosis not present

## 2020-11-30 DIAGNOSIS — H52203 Unspecified astigmatism, bilateral: Secondary | ICD-10-CM | POA: Diagnosis not present

## 2020-12-04 DIAGNOSIS — C8308 Small cell B-cell lymphoma, lymph nodes of multiple sites: Secondary | ICD-10-CM | POA: Diagnosis not present

## 2020-12-04 DIAGNOSIS — C8300 Small cell B-cell lymphoma, unspecified site: Secondary | ICD-10-CM | POA: Diagnosis not present

## 2021-01-07 DIAGNOSIS — L812 Freckles: Secondary | ICD-10-CM | POA: Diagnosis not present

## 2021-01-07 DIAGNOSIS — D485 Neoplasm of uncertain behavior of skin: Secondary | ICD-10-CM | POA: Diagnosis not present

## 2021-01-07 DIAGNOSIS — Z85828 Personal history of other malignant neoplasm of skin: Secondary | ICD-10-CM | POA: Diagnosis not present

## 2021-01-07 DIAGNOSIS — L821 Other seborrheic keratosis: Secondary | ICD-10-CM | POA: Diagnosis not present

## 2021-01-07 DIAGNOSIS — L820 Inflamed seborrheic keratosis: Secondary | ICD-10-CM | POA: Diagnosis not present

## 2021-01-24 DIAGNOSIS — J349 Unspecified disorder of nose and nasal sinuses: Secondary | ICD-10-CM | POA: Diagnosis not present

## 2021-01-24 DIAGNOSIS — Z20828 Contact with and (suspected) exposure to other viral communicable diseases: Secondary | ICD-10-CM | POA: Diagnosis not present

## 2021-01-24 DIAGNOSIS — R051 Acute cough: Secondary | ICD-10-CM | POA: Diagnosis not present

## 2021-01-24 DIAGNOSIS — J0100 Acute maxillary sinusitis, unspecified: Secondary | ICD-10-CM | POA: Diagnosis not present

## 2021-03-05 DIAGNOSIS — C8300 Small cell B-cell lymphoma, unspecified site: Secondary | ICD-10-CM | POA: Diagnosis not present

## 2021-03-05 DIAGNOSIS — Z0000 Encounter for general adult medical examination without abnormal findings: Secondary | ICD-10-CM | POA: Diagnosis not present

## 2021-03-05 DIAGNOSIS — R7989 Other specified abnormal findings of blood chemistry: Secondary | ICD-10-CM | POA: Diagnosis not present

## 2021-03-05 DIAGNOSIS — C8308 Small cell B-cell lymphoma, lymph nodes of multiple sites: Secondary | ICD-10-CM | POA: Diagnosis not present

## 2021-04-19 DIAGNOSIS — Z6822 Body mass index (BMI) 22.0-22.9, adult: Secondary | ICD-10-CM | POA: Diagnosis not present

## 2021-04-19 DIAGNOSIS — Z0000 Encounter for general adult medical examination without abnormal findings: Secondary | ICD-10-CM | POA: Diagnosis not present

## 2021-04-19 DIAGNOSIS — Z125 Encounter for screening for malignant neoplasm of prostate: Secondary | ICD-10-CM | POA: Diagnosis not present

## 2021-04-19 DIAGNOSIS — D509 Iron deficiency anemia, unspecified: Secondary | ICD-10-CM | POA: Diagnosis not present

## 2021-04-19 DIAGNOSIS — Z1331 Encounter for screening for depression: Secondary | ICD-10-CM | POA: Diagnosis not present

## 2021-05-31 DIAGNOSIS — C8300 Small cell B-cell lymphoma, unspecified site: Secondary | ICD-10-CM | POA: Diagnosis not present

## 2021-06-04 DIAGNOSIS — C8300 Small cell B-cell lymphoma, unspecified site: Secondary | ICD-10-CM | POA: Diagnosis not present

## 2021-06-24 DIAGNOSIS — E538 Deficiency of other specified B group vitamins: Secondary | ICD-10-CM | POA: Diagnosis not present

## 2021-07-24 DIAGNOSIS — L03031 Cellulitis of right toe: Secondary | ICD-10-CM | POA: Diagnosis not present

## 2021-07-24 DIAGNOSIS — D1801 Hemangioma of skin and subcutaneous tissue: Secondary | ICD-10-CM | POA: Diagnosis not present

## 2021-07-24 DIAGNOSIS — Z85828 Personal history of other malignant neoplasm of skin: Secondary | ICD-10-CM | POA: Diagnosis not present

## 2021-07-24 DIAGNOSIS — L812 Freckles: Secondary | ICD-10-CM | POA: Diagnosis not present

## 2021-07-24 DIAGNOSIS — L03011 Cellulitis of right finger: Secondary | ICD-10-CM | POA: Diagnosis not present

## 2021-07-24 DIAGNOSIS — L570 Actinic keratosis: Secondary | ICD-10-CM | POA: Diagnosis not present

## 2021-07-24 DIAGNOSIS — L578 Other skin changes due to chronic exposure to nonionizing radiation: Secondary | ICD-10-CM | POA: Diagnosis not present

## 2021-07-24 DIAGNOSIS — L821 Other seborrheic keratosis: Secondary | ICD-10-CM | POA: Diagnosis not present

## 2021-08-02 DIAGNOSIS — D510 Vitamin B12 deficiency anemia due to intrinsic factor deficiency: Secondary | ICD-10-CM | POA: Diagnosis not present

## 2021-08-30 DIAGNOSIS — D510 Vitamin B12 deficiency anemia due to intrinsic factor deficiency: Secondary | ICD-10-CM | POA: Diagnosis not present

## 2021-09-09 DIAGNOSIS — N528 Other male erectile dysfunction: Secondary | ICD-10-CM | POA: Diagnosis not present

## 2021-09-09 DIAGNOSIS — Z8551 Personal history of malignant neoplasm of bladder: Secondary | ICD-10-CM | POA: Diagnosis not present

## 2021-09-10 DIAGNOSIS — C9111 Chronic lymphocytic leukemia of B-cell type in remission: Secondary | ICD-10-CM | POA: Diagnosis not present

## 2021-09-10 DIAGNOSIS — C8300 Small cell B-cell lymphoma, unspecified site: Secondary | ICD-10-CM | POA: Diagnosis not present

## 2021-10-11 DIAGNOSIS — E538 Deficiency of other specified B group vitamins: Secondary | ICD-10-CM | POA: Diagnosis not present

## 2021-11-22 DIAGNOSIS — R5381 Other malaise: Secondary | ICD-10-CM | POA: Diagnosis not present

## 2021-11-22 DIAGNOSIS — I4891 Unspecified atrial fibrillation: Secondary | ICD-10-CM | POA: Diagnosis not present

## 2021-11-22 DIAGNOSIS — R9431 Abnormal electrocardiogram [ECG] [EKG]: Secondary | ICD-10-CM | POA: Diagnosis not present

## 2021-11-22 DIAGNOSIS — R5383 Other fatigue: Secondary | ICD-10-CM | POA: Diagnosis not present

## 2021-11-22 DIAGNOSIS — Z6822 Body mass index (BMI) 22.0-22.9, adult: Secondary | ICD-10-CM | POA: Diagnosis not present

## 2021-11-26 ENCOUNTER — Other Ambulatory Visit: Payer: Self-pay

## 2021-11-26 ENCOUNTER — Ambulatory Visit: Payer: Medicare PPO | Admitting: Cardiology

## 2021-11-26 VITALS — BP 130/72 | HR 75 | Ht 70.0 in | Wt 163.4 lb

## 2021-11-26 DIAGNOSIS — Z8551 Personal history of malignant neoplasm of bladder: Secondary | ICD-10-CM | POA: Insufficient documentation

## 2021-11-26 DIAGNOSIS — Z7901 Long term (current) use of anticoagulants: Secondary | ICD-10-CM

## 2021-11-26 DIAGNOSIS — J302 Other seasonal allergic rhinitis: Secondary | ICD-10-CM | POA: Insufficient documentation

## 2021-11-26 DIAGNOSIS — N529 Male erectile dysfunction, unspecified: Secondary | ICD-10-CM | POA: Insufficient documentation

## 2021-11-26 DIAGNOSIS — Z8572 Personal history of non-Hodgkin lymphomas: Secondary | ICD-10-CM

## 2021-11-26 DIAGNOSIS — L501 Idiopathic urticaria: Secondary | ICD-10-CM | POA: Insufficient documentation

## 2021-11-26 DIAGNOSIS — I480 Paroxysmal atrial fibrillation: Secondary | ICD-10-CM

## 2021-11-26 DIAGNOSIS — D510 Vitamin B12 deficiency anemia due to intrinsic factor deficiency: Secondary | ICD-10-CM | POA: Insufficient documentation

## 2021-11-26 MED ORDER — WARFARIN SODIUM 5 MG PO TABS: 5.0000 mg | ORAL_TABLET | Freq: Every day | ORAL | 2 refills | Status: AC

## 2021-11-26 MED ORDER — METOPROLOL TARTRATE 25 MG PO TABS: 25.0000 mg | ORAL_TABLET | Freq: Four times a day (QID) | ORAL | 1 refills | Status: AC | PRN

## 2021-11-26 NOTE — Progress Notes (Signed)
Cardiology Office Note:    Date:  11/26/2021   ID:  Sean Mcintosh [birth year], MRN [id number]  PCP:  Angelina Sheriff, MD  Cardiologist:  Shirlee More, MD   Referring MD: Angelina Sheriff, MD  ASSESSMENT:    1. Paroxysmal atrial fibrillation (HCC)   2. Hx of non-Hodgkin's lymphoma   3. Chronic anticoagulation    PLAN:    In order of problems listed above:  Sean Mcintosh had atrial fibrillation for 2 to 3 days last week which appears to be associated with his tyrosine kinase inhibitor for his lymphoma.  He stopped the drug he has notified his just.  With concerns of interactions with diltiazem we will discontinue and give him a prescription to take Lopressor as needed he will activate atrial fibrillation detection on his watch.  There is also concern of direct anticoagulants and will transition to warfarin.  Check echocardiogram for structural heart disease he will come Monday for a pro time to the office and we will need to direct him to the warfarin clinic.  I will see him back in the office in several weeks.  Next appointment 2 weeks   Medication Adjustments/Labs and Tests Ordered: Current medicines are reviewed at length with the patient today.  Concerns regarding medicines are outlined above.  No orders of the defined types were placed in this encounter.  No orders of the defined types were placed in this encounter.    Chief Complaint  Patient presents with   Atrial Fibrillation    1 week ago    History of Present Illness:    Sean Mcintosh is a 71 y.o. male with a history of non-Hodgkin's lymphoma mild CKD bladder cancer in remission who is being seen today for the evaluation of atrial fibrillation at the request of [place], John F. II, MD. Chart review shows no cardiology evaluation or testing available within epic and Care Everywhere  He was seen by his PCP 11/22/2021 with palpitation found to be in atrial fibrillation with a rapid rate noted troponin drawn  as an outpatient which was normal.  Recent labs [facility] 09/10/2021 showed a hemoglobin of 14.2 hematocrit of 42 platelets 159,000 sodium 141 potassium 3.9 creatinine 1.13 GFR 70 cc.  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  He converted afterwards.  It was not particularly symptomatic no chest pain shortness of breath or syncope.  He takes a chemotherapeutic agent tyrosine kinase inhibitor for his lymphoma which is associated with proarrhythmia and atrial fibrillation.  He stopped the drug he sent message to his oncologist was placed on diltiazem and Eliquis.  He has had no previous episodes he has no known heart disease.  I did a quick literature search and is best avoid calcium channel blockers associated with this tyrosine kinase inhibitor they caution against direct anticoagulants  We will stop both drugs and him a prescription for Lopressor to take as needed for rapid heart rate and transition to warfarin.  Also echocardiogram to assess for structural heart disease Past Medical History:  Diagnosis Date   Chronic idiopathic urticaria    Erectile dysfunction    History of bladder cancer    Hx of non-Hodgkin's lymphoma    followed by Oncology in [facility]   Pernicious anemia    Seasonal allergies     Past Surgical  History:  Procedure Laterality Date   Bladder cancer surgery     Multiple 2009-2014   INGUINAL LYMPH NODE BIOPSY Left 04/24/2017    Current Medications: Current Meds  Medication Sig   diclofenac Sodium (VOLTAREN) 1 % GEL Apply 2 g topically in the morning, at noon, in the evening, and at bedtime.   ELIQUIS 5 MG TABS tablet Take 5 mg by mouth 2 (two) times daily.   fluticasone (FLONASE) 50 MCG/ACT nasal spray Place 2 sprays into both nostrils daily.   IMBRUVICA 420 MG tablet Take 420 mg by mouth daily.   sildenafil  (REVATIO) 20 MG tablet Take 20 mg by mouth daily as needed for erectile dysfunction. Take 2 to 5 tablets daily as needed   [DISCONTINUED] diltiazem (CARDIZEM CD) 120 MG 24 hr capsule Take 120 mg by mouth daily.     Allergies:   Sulfa antibiotics and Tetracyclines & related   Social History   Socioeconomic History   Marital status: Married    Spouse name: Not on file   Number of children: Not on file   Years of education: Not on file   Highest education level: Not on file  Occupational History   Not on file  Tobacco Use   Smoking status: Former    Types: Cigarettes   Smokeless tobacco: Never  Substance and Sexual Activity   Alcohol use: Not Currently   Drug use: Never   Sexual activity: Yes  Other Topics Concern   Not on file  Social History Narrative   ** Merged History Encounter **       Social Determinants of Health   Financial Resource Strain: Not on file  Food Insecurity: Not on file  Transportation Needs: Not on file  Physical Activity: Not on file  Stress: Not on file  Social Connections: Not on file     Family History: The patient's family history includes Diabetes in his mother and sibling; Diabetes type I in his son; Other in his father.  ROS:   ROS Please see the history of present illness.     All other systems reviewed and are negative.  EKGs/Labs/Other Studies Reviewed:    The following studies were reviewed today:   EKG:  EKG is  ordered today.  The ekg ordered today is personally reviewed and demonstrates sinus rhythm normal EKG   Physical Exam:    VS:  BP 130/72 (BP Location: Left Arm, Patient Position: Sitting)    Pulse 75    Ht 5\' 10"  (1.778 m)    Wt 163 lb 6.4 oz (74.1 kg)    SpO2 96%    BMI 23.45 kg/m     Wt Readings from Last 3 Encounters:  11/26/21 163 lb 6.4 oz (74.1 kg)     GEN:  Well nourished, well developed in no acute distress HEENT: Normal NECK: No JVD; No carotid bruits LYMPHATICS: No lymphadenopathy CARDIAC: RRR, no  murmurs, rubs, gallops RESPIRATORY:  Clear to auscultation without rales, wheezing or rhonchi  ABDOMEN: Soft, non-tender, non-distended MUSCULOSKELETAL:  No edema; No deformity  SKIN: Warm and dry NEUROLOGIC:  Alert and oriented x 3 PSYCHIATRIC:  Normal affect     Signed, Shirlee More, MD  11/26/2021 3:48 PM    [facility]

## 2021-11-26 NOTE — Patient Instructions (Signed)
Medication Instructions:  Your physician has recommended you make the following change in your medication:   STOP: Diltiazem STOP: Eliquis START: Lopressor 25 mg every 6 hours PRN for HR greater than 130 or Atrial fibrillation START: Coumadin 5 mg daily (Wait 2 full doses of Eliquis to start)  *If you need a refill on your cardiac medications before your next appointment, please call your pharmacy*   Lab Work: Your physician recommends that you return for lab work in:   Labs on Monday: PT/INR  If you have labs (blood work) drawn today and your tests are completely normal, you will receive your results only by: [employer] (if you have MyChart) OR A paper copy in the mail If you have any lab test that is abnormal or we need to change your treatment, we will call you to review the results.   Testing/Procedures: Your physician has requested that you have an echocardiogram. Echocardiography is a painless test that uses sound waves to create images of your heart. It provides your doctor with information about the size and shape of your heart and how well your hearts chambers and valves are working. This procedure takes approximately one hour. There are no restrictions for this procedure.    Follow-Up: At [facility], you and your health needs are our priority.  As part of our continuing mission to provide you with exceptional heart care, we have created designated Provider Care Teams.  These Care Teams include your primary Cardiologist (physician) and Advanced Practice Providers (APPs -  Physician Assistants and Nurse Practitioners) who all work together to provide you with the care you need, when you need it.  We recommend signing up for the patient portal called "MyChart".  Sign up information is provided on this After Visit Summary.  MyChart is used to connect with patients for Virtual Visits (Telemedicine).  Patients are able to view lab/test results, encounter notes, upcoming  appointments, etc.  Non-urgent messages can be sent to your provider as well.   To learn more about what you can do with MyChart, go to [email or web address].    Your next appointment:   4 week(s)  The format for your next appointment:   In Person  Provider:   Shirlee More, MD    Other Instructions None

## 2021-12-02 ENCOUNTER — Other Ambulatory Visit: Payer: Medicare PPO

## 2021-12-09 ENCOUNTER — Other Ambulatory Visit: Payer: Medicare PPO

## 2021-12-09 DIAGNOSIS — H5203 Hypermetropia, bilateral: Secondary | ICD-10-CM | POA: Diagnosis not present

## 2021-12-09 DIAGNOSIS — H2513 Age-related nuclear cataract, bilateral: Secondary | ICD-10-CM | POA: Diagnosis not present

## 2021-12-17 DIAGNOSIS — Z8616 Personal history of COVID-19: Secondary | ICD-10-CM | POA: Diagnosis not present

## 2021-12-17 DIAGNOSIS — C8300 Small cell B-cell lymphoma, unspecified site: Secondary | ICD-10-CM | POA: Diagnosis not present

## 2021-12-23 ENCOUNTER — Ambulatory Visit: Payer: Medicare PPO | Admitting: Cardiology

## 2022-01-03 DIAGNOSIS — D510 Vitamin B12 deficiency anemia due to intrinsic factor deficiency: Secondary | ICD-10-CM | POA: Diagnosis not present

## 2022-02-04 DIAGNOSIS — L84 Corns and callosities: Secondary | ICD-10-CM | POA: Diagnosis not present

## 2022-02-04 DIAGNOSIS — D1801 Hemangioma of skin and subcutaneous tissue: Secondary | ICD-10-CM | POA: Diagnosis not present

## 2022-02-04 DIAGNOSIS — L03031 Cellulitis of right toe: Secondary | ICD-10-CM | POA: Diagnosis not present

## 2022-02-04 DIAGNOSIS — L821 Other seborrheic keratosis: Secondary | ICD-10-CM | POA: Diagnosis not present

## 2022-02-04 DIAGNOSIS — Z85828 Personal history of other malignant neoplasm of skin: Secondary | ICD-10-CM | POA: Diagnosis not present

## 2022-02-04 DIAGNOSIS — L570 Actinic keratosis: Secondary | ICD-10-CM | POA: Diagnosis not present

## 2022-02-20 DIAGNOSIS — D519 Vitamin B12 deficiency anemia, unspecified: Secondary | ICD-10-CM | POA: Diagnosis not present

## 2022-03-11 DIAGNOSIS — C969 Malignant neoplasm of lymphoid, hematopoietic and related tissue, unspecified: Secondary | ICD-10-CM | POA: Diagnosis not present

## 2022-03-11 DIAGNOSIS — R590 Localized enlarged lymph nodes: Secondary | ICD-10-CM | POA: Diagnosis not present

## 2022-03-11 DIAGNOSIS — C8300 Small cell B-cell lymphoma, unspecified site: Secondary | ICD-10-CM | POA: Diagnosis not present

## 2022-03-18 DIAGNOSIS — C8300 Small cell B-cell lymphoma, unspecified site: Secondary | ICD-10-CM | POA: Diagnosis not present

## 2022-03-27 DIAGNOSIS — C8300 Small cell B-cell lymphoma, unspecified site: Secondary | ICD-10-CM | POA: Diagnosis not present

## 2022-04-07 DIAGNOSIS — D519 Vitamin B12 deficiency anemia, unspecified: Secondary | ICD-10-CM | POA: Diagnosis not present

## 2022-04-10 DIAGNOSIS — J40 Bronchitis, not specified as acute or chronic: Secondary | ICD-10-CM | POA: Diagnosis not present

## 2022-04-10 DIAGNOSIS — J329 Chronic sinusitis, unspecified: Secondary | ICD-10-CM | POA: Diagnosis not present

## 2022-04-10 DIAGNOSIS — Z8572 Personal history of non-Hodgkin lymphomas: Secondary | ICD-10-CM | POA: Diagnosis not present

## 2022-04-16 DIAGNOSIS — C9110 Chronic lymphocytic leukemia of B-cell type not having achieved remission: Secondary | ICD-10-CM | POA: Diagnosis not present

## 2022-04-18 DIAGNOSIS — Z1159 Encounter for screening for other viral diseases: Secondary | ICD-10-CM | POA: Diagnosis not present

## 2022-04-18 DIAGNOSIS — C8300 Small cell B-cell lymphoma, unspecified site: Secondary | ICD-10-CM | POA: Diagnosis not present

## 2022-04-19 DIAGNOSIS — J209 Acute bronchitis, unspecified: Secondary | ICD-10-CM | POA: Diagnosis not present

## 2022-04-19 DIAGNOSIS — R051 Acute cough: Secondary | ICD-10-CM | POA: Diagnosis not present

## 2022-04-21 DIAGNOSIS — J329 Chronic sinusitis, unspecified: Secondary | ICD-10-CM | POA: Diagnosis not present

## 2022-04-21 DIAGNOSIS — J302 Other seasonal allergic rhinitis: Secondary | ICD-10-CM | POA: Diagnosis not present

## 2022-04-21 DIAGNOSIS — J40 Bronchitis, not specified as acute or chronic: Secondary | ICD-10-CM | POA: Diagnosis not present

## 2022-04-22 DIAGNOSIS — C8300 Small cell B-cell lymphoma, unspecified site: Secondary | ICD-10-CM | POA: Diagnosis not present

## 2022-04-22 DIAGNOSIS — N289 Disorder of kidney and ureter, unspecified: Secondary | ICD-10-CM | POA: Diagnosis not present

## 2022-04-22 DIAGNOSIS — Z5111 Encounter for antineoplastic chemotherapy: Secondary | ICD-10-CM | POA: Diagnosis not present

## 2022-05-20 DIAGNOSIS — C8300 Small cell B-cell lymphoma, unspecified site: Secondary | ICD-10-CM | POA: Diagnosis not present

## 2022-06-11 DIAGNOSIS — D519 Vitamin B12 deficiency anemia, unspecified: Secondary | ICD-10-CM | POA: Diagnosis not present

## 2022-06-17 DIAGNOSIS — C9110 Chronic lymphocytic leukemia of B-cell type not having achieved remission: Secondary | ICD-10-CM | POA: Diagnosis not present

## 2022-07-04 DIAGNOSIS — C8590 Non-Hodgkin lymphoma, unspecified, unspecified site: Secondary | ICD-10-CM | POA: Diagnosis not present

## 2022-07-25 DIAGNOSIS — R0982 Postnasal drip: Secondary | ICD-10-CM | POA: Diagnosis not present

## 2022-07-25 DIAGNOSIS — R051 Acute cough: Secondary | ICD-10-CM | POA: Diagnosis not present

## 2022-07-28 DIAGNOSIS — L821 Other seborrheic keratosis: Secondary | ICD-10-CM | POA: Diagnosis not present

## 2022-07-28 DIAGNOSIS — D225 Melanocytic nevi of trunk: Secondary | ICD-10-CM | POA: Diagnosis not present

## 2022-07-28 DIAGNOSIS — D1801 Hemangioma of skin and subcutaneous tissue: Secondary | ICD-10-CM | POA: Diagnosis not present

## 2022-07-28 DIAGNOSIS — D485 Neoplasm of uncertain behavior of skin: Secondary | ICD-10-CM | POA: Diagnosis not present

## 2022-07-28 DIAGNOSIS — L570 Actinic keratosis: Secondary | ICD-10-CM | POA: Diagnosis not present

## 2022-07-28 DIAGNOSIS — C44622 Squamous cell carcinoma of skin of right upper limb, including shoulder: Secondary | ICD-10-CM | POA: Diagnosis not present

## 2022-07-28 DIAGNOSIS — Z85828 Personal history of other malignant neoplasm of skin: Secondary | ICD-10-CM | POA: Diagnosis not present

## 2022-07-28 DIAGNOSIS — L812 Freckles: Secondary | ICD-10-CM | POA: Diagnosis not present

## 2022-07-29 DIAGNOSIS — C8300 Small cell B-cell lymphoma, unspecified site: Secondary | ICD-10-CM | POA: Diagnosis not present

## 2022-07-29 DIAGNOSIS — Z5111 Encounter for antineoplastic chemotherapy: Secondary | ICD-10-CM | POA: Diagnosis not present

## 2022-07-30 DIAGNOSIS — C8300 Small cell B-cell lymphoma, unspecified site: Secondary | ICD-10-CM | POA: Diagnosis not present

## 2022-08-01 DIAGNOSIS — J40 Bronchitis, not specified as acute or chronic: Secondary | ICD-10-CM | POA: Diagnosis not present

## 2022-08-01 DIAGNOSIS — J329 Chronic sinusitis, unspecified: Secondary | ICD-10-CM | POA: Diagnosis not present

## 2022-08-05 DIAGNOSIS — C8300 Small cell B-cell lymphoma, unspecified site: Secondary | ICD-10-CM | POA: Diagnosis not present

## 2022-08-06 DIAGNOSIS — C8300 Small cell B-cell lymphoma, unspecified site: Secondary | ICD-10-CM | POA: Diagnosis not present

## 2022-08-12 DIAGNOSIS — C8300 Small cell B-cell lymphoma, unspecified site: Secondary | ICD-10-CM | POA: Diagnosis not present

## 2022-08-19 DIAGNOSIS — C8300 Small cell B-cell lymphoma, unspecified site: Secondary | ICD-10-CM | POA: Diagnosis not present

## 2022-08-26 DIAGNOSIS — C8590 Non-Hodgkin lymphoma, unspecified, unspecified site: Secondary | ICD-10-CM | POA: Diagnosis not present

## 2022-08-26 DIAGNOSIS — C8300 Small cell B-cell lymphoma, unspecified site: Secondary | ICD-10-CM | POA: Diagnosis not present

## 2022-09-15 DIAGNOSIS — N400 Enlarged prostate without lower urinary tract symptoms: Secondary | ICD-10-CM | POA: Diagnosis not present

## 2022-09-15 DIAGNOSIS — Z8551 Personal history of malignant neoplasm of bladder: Secondary | ICD-10-CM | POA: Diagnosis not present

## 2022-09-17 DIAGNOSIS — D519 Vitamin B12 deficiency anemia, unspecified: Secondary | ICD-10-CM | POA: Diagnosis not present

## 2022-09-23 DIAGNOSIS — C8300 Small cell B-cell lymphoma, unspecified site: Secondary | ICD-10-CM | POA: Diagnosis not present

## 2022-10-01 DIAGNOSIS — J40 Bronchitis, not specified as acute or chronic: Secondary | ICD-10-CM | POA: Diagnosis not present

## 2022-10-01 DIAGNOSIS — Z6822 Body mass index (BMI) 22.0-22.9, adult: Secondary | ICD-10-CM | POA: Diagnosis not present

## 2022-10-01 DIAGNOSIS — H1031 Unspecified acute conjunctivitis, right eye: Secondary | ICD-10-CM | POA: Diagnosis not present

## 2022-10-01 DIAGNOSIS — J329 Chronic sinusitis, unspecified: Secondary | ICD-10-CM | POA: Diagnosis not present

## 2022-10-28 DIAGNOSIS — C8300 Small cell B-cell lymphoma, unspecified site: Secondary | ICD-10-CM | POA: Diagnosis not present

## 2022-11-13 DIAGNOSIS — D519 Vitamin B12 deficiency anemia, unspecified: Secondary | ICD-10-CM | POA: Diagnosis not present

## 2022-11-25 DIAGNOSIS — C8300 Small cell B-cell lymphoma, unspecified site: Secondary | ICD-10-CM | POA: Diagnosis not present

## 2022-11-28 DIAGNOSIS — J302 Other seasonal allergic rhinitis: Secondary | ICD-10-CM | POA: Diagnosis not present

## 2022-11-28 DIAGNOSIS — Z6822 Body mass index (BMI) 22.0-22.9, adult: Secondary | ICD-10-CM | POA: Diagnosis not present

## 2022-11-28 DIAGNOSIS — J40 Bronchitis, not specified as acute or chronic: Secondary | ICD-10-CM | POA: Diagnosis not present

## 2022-11-28 DIAGNOSIS — J329 Chronic sinusitis, unspecified: Secondary | ICD-10-CM | POA: Diagnosis not present

## 2022-12-12 DIAGNOSIS — D519 Vitamin B12 deficiency anemia, unspecified: Secondary | ICD-10-CM | POA: Diagnosis not present

## 2022-12-26 DIAGNOSIS — H52203 Unspecified astigmatism, bilateral: Secondary | ICD-10-CM | POA: Diagnosis not present

## 2022-12-26 DIAGNOSIS — H2513 Age-related nuclear cataract, bilateral: Secondary | ICD-10-CM | POA: Diagnosis not present

## 2023-01-02 DIAGNOSIS — Z6822 Body mass index (BMI) 22.0-22.9, adult: Secondary | ICD-10-CM | POA: Diagnosis not present

## 2023-01-02 DIAGNOSIS — H1030 Unspecified acute conjunctivitis, unspecified eye: Secondary | ICD-10-CM | POA: Diagnosis not present

## 2023-01-02 DIAGNOSIS — J302 Other seasonal allergic rhinitis: Secondary | ICD-10-CM | POA: Diagnosis not present

## 2023-01-14 DIAGNOSIS — C8300 Small cell B-cell lymphoma, unspecified site: Secondary | ICD-10-CM | POA: Diagnosis not present

## 2023-01-20 DIAGNOSIS — C8300 Small cell B-cell lymphoma, unspecified site: Secondary | ICD-10-CM | POA: Diagnosis not present

## 2023-01-28 DIAGNOSIS — Z85828 Personal history of other malignant neoplasm of skin: Secondary | ICD-10-CM | POA: Diagnosis not present

## 2023-01-28 DIAGNOSIS — L570 Actinic keratosis: Secondary | ICD-10-CM | POA: Diagnosis not present

## 2023-01-28 DIAGNOSIS — L820 Inflamed seborrheic keratosis: Secondary | ICD-10-CM | POA: Diagnosis not present

## 2023-01-28 DIAGNOSIS — D692 Other nonthrombocytopenic purpura: Secondary | ICD-10-CM | POA: Diagnosis not present

## 2023-01-28 DIAGNOSIS — L821 Other seborrheic keratosis: Secondary | ICD-10-CM | POA: Diagnosis not present

## 2023-01-28 DIAGNOSIS — L309 Dermatitis, unspecified: Secondary | ICD-10-CM | POA: Diagnosis not present

## 2023-01-28 DIAGNOSIS — L853 Xerosis cutis: Secondary | ICD-10-CM | POA: Diagnosis not present

## 2023-02-04 DIAGNOSIS — D519 Vitamin B12 deficiency anemia, unspecified: Secondary | ICD-10-CM | POA: Diagnosis not present

## 2023-02-16 DIAGNOSIS — J40 Bronchitis, not specified as acute or chronic: Secondary | ICD-10-CM | POA: Diagnosis not present

## 2023-02-16 DIAGNOSIS — H109 Unspecified conjunctivitis: Secondary | ICD-10-CM | POA: Diagnosis not present

## 2023-02-16 DIAGNOSIS — J329 Chronic sinusitis, unspecified: Secondary | ICD-10-CM | POA: Diagnosis not present

## 2023-03-23 DIAGNOSIS — D519 Vitamin B12 deficiency anemia, unspecified: Secondary | ICD-10-CM | POA: Diagnosis not present

## 2023-04-14 DIAGNOSIS — C8300 Small cell B-cell lymphoma, unspecified site: Secondary | ICD-10-CM | POA: Diagnosis not present

## 2023-04-14 DIAGNOSIS — C8308 Small cell B-cell lymphoma, lymph nodes of multiple sites: Secondary | ICD-10-CM | POA: Diagnosis not present

## 2023-04-20 DIAGNOSIS — D519 Vitamin B12 deficiency anemia, unspecified: Secondary | ICD-10-CM | POA: Diagnosis not present

## 2023-04-24 DIAGNOSIS — Z6822 Body mass index (BMI) 22.0-22.9, adult: Secondary | ICD-10-CM | POA: Diagnosis not present

## 2023-04-24 DIAGNOSIS — J069 Acute upper respiratory infection, unspecified: Secondary | ICD-10-CM | POA: Diagnosis not present

## 2023-04-24 DIAGNOSIS — R319 Hematuria, unspecified: Secondary | ICD-10-CM | POA: Diagnosis not present

## 2023-04-24 DIAGNOSIS — N390 Urinary tract infection, site not specified: Secondary | ICD-10-CM | POA: Diagnosis not present

## 2023-05-05 DIAGNOSIS — C8300 Small cell B-cell lymphoma, unspecified site: Secondary | ICD-10-CM | POA: Diagnosis not present

## 2023-05-22 DIAGNOSIS — D510 Vitamin B12 deficiency anemia due to intrinsic factor deficiency: Secondary | ICD-10-CM | POA: Diagnosis not present

## 2023-06-11 DIAGNOSIS — C8300 Small cell B-cell lymphoma, unspecified site: Secondary | ICD-10-CM | POA: Diagnosis not present

## 2023-06-11 DIAGNOSIS — D510 Vitamin B12 deficiency anemia due to intrinsic factor deficiency: Secondary | ICD-10-CM | POA: Diagnosis not present

## 2023-06-11 DIAGNOSIS — I480 Paroxysmal atrial fibrillation: Secondary | ICD-10-CM | POA: Diagnosis not present

## 2023-06-11 DIAGNOSIS — Z125 Encounter for screening for malignant neoplasm of prostate: Secondary | ICD-10-CM | POA: Diagnosis not present

## 2023-06-11 DIAGNOSIS — Z9181 History of falling: Secondary | ICD-10-CM | POA: Diagnosis not present

## 2023-06-11 DIAGNOSIS — J302 Other seasonal allergic rhinitis: Secondary | ICD-10-CM | POA: Diagnosis not present

## 2023-06-11 DIAGNOSIS — Z1339 Encounter for screening examination for other mental health and behavioral disorders: Secondary | ICD-10-CM | POA: Diagnosis not present

## 2023-06-11 DIAGNOSIS — Z0000 Encounter for general adult medical examination without abnormal findings: Secondary | ICD-10-CM | POA: Diagnosis not present

## 2023-06-16 DIAGNOSIS — D519 Vitamin B12 deficiency anemia, unspecified: Secondary | ICD-10-CM | POA: Diagnosis not present

## 2023-06-30 DIAGNOSIS — N138 Other obstructive and reflux uropathy: Secondary | ICD-10-CM | POA: Diagnosis not present

## 2023-06-30 DIAGNOSIS — N401 Enlarged prostate with lower urinary tract symptoms: Secondary | ICD-10-CM | POA: Diagnosis not present

## 2023-07-07 DIAGNOSIS — R590 Localized enlarged lymph nodes: Secondary | ICD-10-CM | POA: Diagnosis not present

## 2023-07-07 DIAGNOSIS — C8300 Small cell B-cell lymphoma, unspecified site: Secondary | ICD-10-CM | POA: Diagnosis not present

## 2023-07-07 DIAGNOSIS — C969 Malignant neoplasm of lymphoid, hematopoietic and related tissue, unspecified: Secondary | ICD-10-CM | POA: Diagnosis not present

## 2023-07-29 DIAGNOSIS — D519 Vitamin B12 deficiency anemia, unspecified: Secondary | ICD-10-CM | POA: Diagnosis not present

## 2023-07-30 DIAGNOSIS — L821 Other seborrheic keratosis: Secondary | ICD-10-CM | POA: Diagnosis not present

## 2023-07-30 DIAGNOSIS — L720 Epidermal cyst: Secondary | ICD-10-CM | POA: Diagnosis not present

## 2023-07-30 DIAGNOSIS — Z85828 Personal history of other malignant neoplasm of skin: Secondary | ICD-10-CM | POA: Diagnosis not present

## 2023-07-30 DIAGNOSIS — C44529 Squamous cell carcinoma of skin of other part of trunk: Secondary | ICD-10-CM | POA: Diagnosis not present

## 2023-07-30 DIAGNOSIS — D692 Other nonthrombocytopenic purpura: Secondary | ICD-10-CM | POA: Diagnosis not present

## 2023-07-30 DIAGNOSIS — D485 Neoplasm of uncertain behavior of skin: Secondary | ICD-10-CM | POA: Diagnosis not present

## 2023-07-30 DIAGNOSIS — L820 Inflamed seborrheic keratosis: Secondary | ICD-10-CM | POA: Diagnosis not present

## 2023-07-30 DIAGNOSIS — D2271 Melanocytic nevi of right lower limb, including hip: Secondary | ICD-10-CM | POA: Diagnosis not present

## 2023-07-30 DIAGNOSIS — D045 Carcinoma in situ of skin of trunk: Secondary | ICD-10-CM | POA: Diagnosis not present

## 2023-07-30 DIAGNOSIS — L812 Freckles: Secondary | ICD-10-CM | POA: Diagnosis not present

## 2023-07-31 DIAGNOSIS — C8300 Small cell B-cell lymphoma, unspecified site: Secondary | ICD-10-CM | POA: Diagnosis not present

## 2023-09-02 DIAGNOSIS — D519 Vitamin B12 deficiency anemia, unspecified: Secondary | ICD-10-CM | POA: Diagnosis not present

## 2023-09-05 DIAGNOSIS — Z23 Encounter for immunization: Secondary | ICD-10-CM | POA: Diagnosis not present

## 2023-09-05 DIAGNOSIS — S50811A Abrasion of right forearm, initial encounter: Secondary | ICD-10-CM | POA: Diagnosis not present

## 2023-09-14 DIAGNOSIS — C672 Malignant neoplasm of lateral wall of bladder: Secondary | ICD-10-CM | POA: Diagnosis not present

## 2023-10-05 DIAGNOSIS — C8300 Small cell B-cell lymphoma, unspecified site: Secondary | ICD-10-CM | POA: Diagnosis not present

## 2023-11-05 DIAGNOSIS — J329 Chronic sinusitis, unspecified: Secondary | ICD-10-CM | POA: Diagnosis not present

## 2023-11-05 DIAGNOSIS — J40 Bronchitis, not specified as acute or chronic: Secondary | ICD-10-CM | POA: Diagnosis not present

## 2023-11-19 DIAGNOSIS — Z6821 Body mass index (BMI) 21.0-21.9, adult: Secondary | ICD-10-CM | POA: Diagnosis not present

## 2023-11-19 DIAGNOSIS — F4321 Adjustment disorder with depressed mood: Secondary | ICD-10-CM | POA: Diagnosis not present

## 2023-11-19 DIAGNOSIS — C8300 Small cell B-cell lymphoma, unspecified site: Secondary | ICD-10-CM | POA: Diagnosis not present

## 2023-11-19 DIAGNOSIS — J40 Bronchitis, not specified as acute or chronic: Secondary | ICD-10-CM | POA: Diagnosis not present

## 2023-11-19 DIAGNOSIS — J329 Chronic sinusitis, unspecified: Secondary | ICD-10-CM | POA: Diagnosis not present

## 2023-11-19 DIAGNOSIS — H1030 Unspecified acute conjunctivitis, unspecified eye: Secondary | ICD-10-CM | POA: Diagnosis not present

## 2023-11-23 DIAGNOSIS — J329 Chronic sinusitis, unspecified: Secondary | ICD-10-CM | POA: Diagnosis not present

## 2023-11-23 DIAGNOSIS — J310 Chronic rhinitis: Secondary | ICD-10-CM | POA: Diagnosis not present

## 2023-12-17 DIAGNOSIS — D519 Vitamin B12 deficiency anemia, unspecified: Secondary | ICD-10-CM | POA: Diagnosis not present

## 2023-12-28 DIAGNOSIS — J329 Chronic sinusitis, unspecified: Secondary | ICD-10-CM | POA: Diagnosis not present

## 2023-12-28 DIAGNOSIS — J40 Bronchitis, not specified as acute or chronic: Secondary | ICD-10-CM | POA: Diagnosis not present

## 2023-12-28 DIAGNOSIS — C8300 Small cell B-cell lymphoma, unspecified site: Secondary | ICD-10-CM | POA: Diagnosis not present

## 2023-12-31 DIAGNOSIS — C8300 Small cell B-cell lymphoma, unspecified site: Secondary | ICD-10-CM | POA: Diagnosis not present

## 2023-12-31 DIAGNOSIS — D801 Nonfamilial hypogammaglobulinemia: Secondary | ICD-10-CM | POA: Diagnosis not present

## 2024-01-25 DIAGNOSIS — J310 Chronic rhinitis: Secondary | ICD-10-CM | POA: Diagnosis not present

## 2024-01-25 DIAGNOSIS — Z9989 Dependence on other enabling machines and devices: Secondary | ICD-10-CM | POA: Diagnosis not present

## 2024-01-25 DIAGNOSIS — Z5181 Encounter for therapeutic drug level monitoring: Secondary | ICD-10-CM | POA: Diagnosis not present

## 2024-01-25 DIAGNOSIS — J329 Chronic sinusitis, unspecified: Secondary | ICD-10-CM | POA: Diagnosis not present

## 2024-01-25 DIAGNOSIS — Z792 Long term (current) use of antibiotics: Secondary | ICD-10-CM | POA: Diagnosis not present

## 2024-01-25 DIAGNOSIS — G4733 Obstructive sleep apnea (adult) (pediatric): Secondary | ICD-10-CM | POA: Diagnosis not present

## 2024-01-25 DIAGNOSIS — C8590 Non-Hodgkin lymphoma, unspecified, unspecified site: Secondary | ICD-10-CM | POA: Diagnosis not present

## 2024-01-25 DIAGNOSIS — Z882 Allergy status to sulfonamides status: Secondary | ICD-10-CM | POA: Diagnosis not present

## 2024-02-01 DIAGNOSIS — L570 Actinic keratosis: Secondary | ICD-10-CM | POA: Diagnosis not present

## 2024-02-01 DIAGNOSIS — D801 Nonfamilial hypogammaglobulinemia: Secondary | ICD-10-CM | POA: Diagnosis not present

## 2024-02-01 DIAGNOSIS — C8300 Small cell B-cell lymphoma, unspecified site: Secondary | ICD-10-CM | POA: Diagnosis not present

## 2024-02-01 DIAGNOSIS — L821 Other seborrheic keratosis: Secondary | ICD-10-CM | POA: Diagnosis not present

## 2024-02-01 DIAGNOSIS — D1801 Hemangioma of skin and subcutaneous tissue: Secondary | ICD-10-CM | POA: Diagnosis not present

## 2024-02-01 DIAGNOSIS — Z85828 Personal history of other malignant neoplasm of skin: Secondary | ICD-10-CM | POA: Diagnosis not present

## 2024-02-01 DIAGNOSIS — L812 Freckles: Secondary | ICD-10-CM | POA: Diagnosis not present

## 2024-02-11 DIAGNOSIS — K4090 Unilateral inguinal hernia, without obstruction or gangrene, not specified as recurrent: Secondary | ICD-10-CM | POA: Diagnosis not present

## 2024-02-18 DIAGNOSIS — K4090 Unilateral inguinal hernia, without obstruction or gangrene, not specified as recurrent: Secondary | ICD-10-CM | POA: Diagnosis not present

## 2024-02-24 DIAGNOSIS — D519 Vitamin B12 deficiency anemia, unspecified: Secondary | ICD-10-CM | POA: Diagnosis not present

## 2024-02-29 DIAGNOSIS — C8300 Small cell B-cell lymphoma, unspecified site: Secondary | ICD-10-CM | POA: Diagnosis not present

## 2024-02-29 DIAGNOSIS — D801 Nonfamilial hypogammaglobulinemia: Secondary | ICD-10-CM | POA: Diagnosis not present

## 2024-03-29 DIAGNOSIS — D801 Nonfamilial hypogammaglobulinemia: Secondary | ICD-10-CM | POA: Diagnosis not present

## 2024-03-29 DIAGNOSIS — C8300 Small cell B-cell lymphoma, unspecified site: Secondary | ICD-10-CM | POA: Diagnosis not present

## 2024-04-01 DIAGNOSIS — D176 Benign lipomatous neoplasm of spermatic cord: Secondary | ICD-10-CM | POA: Diagnosis not present

## 2024-04-01 DIAGNOSIS — D649 Anemia, unspecified: Secondary | ICD-10-CM | POA: Diagnosis not present

## 2024-04-01 DIAGNOSIS — K4090 Unilateral inguinal hernia, without obstruction or gangrene, not specified as recurrent: Secondary | ICD-10-CM | POA: Diagnosis not present

## 2024-04-26 DIAGNOSIS — D801 Nonfamilial hypogammaglobulinemia: Secondary | ICD-10-CM | POA: Diagnosis not present

## 2024-04-26 DIAGNOSIS — C8300 Small cell B-cell lymphoma, unspecified site: Secondary | ICD-10-CM | POA: Diagnosis not present

## 2024-05-19 DIAGNOSIS — J329 Chronic sinusitis, unspecified: Secondary | ICD-10-CM | POA: Diagnosis not present

## 2024-05-19 DIAGNOSIS — J310 Chronic rhinitis: Secondary | ICD-10-CM | POA: Diagnosis not present

## 2024-05-24 DIAGNOSIS — C8300 Small cell B-cell lymphoma, unspecified site: Secondary | ICD-10-CM | POA: Diagnosis not present

## 2024-05-24 DIAGNOSIS — D801 Nonfamilial hypogammaglobulinemia: Secondary | ICD-10-CM | POA: Diagnosis not present

## 2024-06-09 DIAGNOSIS — H25013 Cortical age-related cataract, bilateral: Secondary | ICD-10-CM | POA: Diagnosis not present

## 2024-06-09 DIAGNOSIS — H5203 Hypermetropia, bilateral: Secondary | ICD-10-CM | POA: Diagnosis not present

## 2024-06-09 DIAGNOSIS — H2513 Age-related nuclear cataract, bilateral: Secondary | ICD-10-CM | POA: Diagnosis not present

## 2024-06-09 DIAGNOSIS — H02403 Unspecified ptosis of bilateral eyelids: Secondary | ICD-10-CM | POA: Diagnosis not present

## 2024-06-16 DIAGNOSIS — D519 Vitamin B12 deficiency anemia, unspecified: Secondary | ICD-10-CM | POA: Diagnosis not present

## 2024-06-28 DIAGNOSIS — C8300 Small cell B-cell lymphoma, unspecified site: Secondary | ICD-10-CM | POA: Diagnosis not present

## 2024-06-28 DIAGNOSIS — D801 Nonfamilial hypogammaglobulinemia: Secondary | ICD-10-CM | POA: Diagnosis not present

## 2024-06-29 DIAGNOSIS — R918 Other nonspecific abnormal finding of lung field: Secondary | ICD-10-CM | POA: Diagnosis not present

## 2024-06-29 DIAGNOSIS — C8300 Small cell B-cell lymphoma, unspecified site: Secondary | ICD-10-CM | POA: Diagnosis not present

## 2024-07-26 DIAGNOSIS — D801 Nonfamilial hypogammaglobulinemia: Secondary | ICD-10-CM | POA: Diagnosis not present

## 2024-07-26 DIAGNOSIS — C8300 Small cell B-cell lymphoma, unspecified site: Secondary | ICD-10-CM | POA: Diagnosis not present

## 2024-08-01 DIAGNOSIS — D519 Vitamin B12 deficiency anemia, unspecified: Secondary | ICD-10-CM | POA: Diagnosis not present

## 2024-08-02 DIAGNOSIS — L821 Other seborrheic keratosis: Secondary | ICD-10-CM | POA: Diagnosis not present

## 2024-08-02 DIAGNOSIS — D692 Other nonthrombocytopenic purpura: Secondary | ICD-10-CM | POA: Diagnosis not present

## 2024-08-02 DIAGNOSIS — D1801 Hemangioma of skin and subcutaneous tissue: Secondary | ICD-10-CM | POA: Diagnosis not present

## 2024-08-02 DIAGNOSIS — L812 Freckles: Secondary | ICD-10-CM | POA: Diagnosis not present

## 2024-08-02 DIAGNOSIS — L570 Actinic keratosis: Secondary | ICD-10-CM | POA: Diagnosis not present

## 2024-08-02 DIAGNOSIS — Z85828 Personal history of other malignant neoplasm of skin: Secondary | ICD-10-CM | POA: Diagnosis not present

## 2024-09-01 DIAGNOSIS — D519 Vitamin B12 deficiency anemia, unspecified: Secondary | ICD-10-CM | POA: Diagnosis not present
